# Patient Record
Sex: Female | Born: 1960 | Race: White | Hispanic: No | Marital: Married | Smoking: Never smoker
Health system: Southern US, Community
[De-identification: ages and names within clinical notes are randomized; demographics above are authoritative.]

## PROBLEM LIST (undated history)

## (undated) DIAGNOSIS — K219 Gastro-esophageal reflux disease without esophagitis: Secondary | ICD-10-CM

## (undated) DIAGNOSIS — Z780 Asymptomatic menopausal state: Secondary | ICD-10-CM

## (undated) DIAGNOSIS — E669 Obesity, unspecified: Secondary | ICD-10-CM

## (undated) DIAGNOSIS — H04123 Dry eye syndrome of bilateral lacrimal glands: Secondary | ICD-10-CM

## (undated) DIAGNOSIS — I341 Nonrheumatic mitral (valve) prolapse: Secondary | ICD-10-CM

## (undated) DIAGNOSIS — I10 Essential (primary) hypertension: Secondary | ICD-10-CM

## (undated) DIAGNOSIS — Z79890 Hormone replacement therapy (postmenopausal): Secondary | ICD-10-CM

## (undated) HISTORY — DX: Gastro-esophageal reflux disease without esophagitis: K21.9

## (undated) HISTORY — DX: Asymptomatic menopausal state: Z78.0

## (undated) HISTORY — DX: Obesity, unspecified: E66.9

## (undated) HISTORY — DX: Nonrheumatic mitral (valve) prolapse: I34.1

## (undated) HISTORY — PX: WISDOM TOOTH EXTRACTION: SHX21

## (undated) HISTORY — PX: DILATION AND CURETTAGE OF UTERUS: SHX78

## (undated) HISTORY — DX: Hormone replacement therapy: Z79.890

## (undated) HISTORY — DX: Dry eye syndrome of bilateral lacrimal glands: H04.123

## (undated) HISTORY — DX: Essential (primary) hypertension: I10

---

## 2008-01-17 ENCOUNTER — Other Ambulatory Visit: Admission: RE | Admit: 2008-01-17 | Discharge: 2008-01-17 | Payer: Self-pay | Admitting: Obstetrics and Gynecology

## 2008-01-23 ENCOUNTER — Encounter: Admission: RE | Admit: 2008-01-23 | Discharge: 2008-01-23 | Payer: Self-pay | Admitting: Obstetrics and Gynecology

## 2010-06-21 ENCOUNTER — Other Ambulatory Visit: Admission: RE | Admit: 2010-06-21 | Discharge: 2010-06-21 | Payer: Self-pay | Admitting: Obstetrics and Gynecology

## 2010-06-23 ENCOUNTER — Ambulatory Visit (HOSPITAL_COMMUNITY): Admission: RE | Admit: 2010-06-23 | Discharge: 2010-06-23 | Payer: Self-pay | Admitting: Obstetrics & Gynecology

## 2011-08-03 ENCOUNTER — Other Ambulatory Visit (HOSPITAL_COMMUNITY)
Admission: RE | Admit: 2011-08-03 | Discharge: 2011-08-03 | Disposition: A | Discharge status: Home / Self Care | Payer: BC Managed Care – PPO | Source: Ambulatory Visit | Attending: Obstetrics and Gynecology | Admitting: Obstetrics and Gynecology

## 2011-08-03 DIAGNOSIS — Z01419 Encounter for gynecological examination (general) (routine) without abnormal findings: Secondary | ICD-10-CM | POA: Insufficient documentation

## 2011-10-06 DIAGNOSIS — R072 Precordial pain: Secondary | ICD-10-CM

## 2011-10-06 DIAGNOSIS — R079 Chest pain, unspecified: Secondary | ICD-10-CM

## 2012-02-13 ENCOUNTER — Emergency Department (HOSPITAL_COMMUNITY)
Admission: EM | Admit: 2012-02-13 | Discharge: 2012-02-13 | Disposition: A | Discharge status: Home Health | Payer: No Typology Code available for payment source | Attending: Emergency Medicine | Admitting: Emergency Medicine

## 2012-02-13 ENCOUNTER — Encounter (HOSPITAL_COMMUNITY): Payer: Self-pay

## 2012-02-13 ENCOUNTER — Emergency Department (HOSPITAL_COMMUNITY): Payer: No Typology Code available for payment source

## 2012-02-13 DIAGNOSIS — I10 Essential (primary) hypertension: Secondary | ICD-10-CM | POA: Insufficient documentation

## 2012-02-13 DIAGNOSIS — R079 Chest pain, unspecified: Secondary | ICD-10-CM | POA: Insufficient documentation

## 2012-02-13 DIAGNOSIS — M542 Cervicalgia: Secondary | ICD-10-CM | POA: Insufficient documentation

## 2012-02-13 DIAGNOSIS — N6489 Other specified disorders of breast: Secondary | ICD-10-CM

## 2012-02-13 DIAGNOSIS — S2000XA Contusion of breast, unspecified breast, initial encounter: Secondary | ICD-10-CM | POA: Insufficient documentation

## 2012-02-13 DIAGNOSIS — F17200 Nicotine dependence, unspecified, uncomplicated: Secondary | ICD-10-CM | POA: Insufficient documentation

## 2012-02-13 DIAGNOSIS — I059 Rheumatic mitral valve disease, unspecified: Secondary | ICD-10-CM | POA: Insufficient documentation

## 2012-02-13 LAB — POCT I-STAT, CHEM 8
BUN: 14 mg/dL (ref 6–23)
Calcium, Ion: 1.15 mmol/L (ref 1.12–1.32)
Chloride: 105 mEq/L (ref 96–112)
Creatinine, Ser: 0.7 mg/dL (ref 0.50–1.10)
Glucose, Bld: 137 mg/dL — ABNORMAL HIGH (ref 70–99)
HCT: 36 % (ref 36.0–46.0)
Hemoglobin: 12.2 g/dL (ref 12.0–15.0)
Potassium: 3.8 mEq/L (ref 3.5–5.1)
Sodium: 140 mEq/L (ref 135–145)
TCO2: 26 mmol/L (ref 0–100)

## 2012-02-13 MED ORDER — ONDANSETRON HCL 4 MG/2ML IJ SOLN
4.0000 mg | Freq: Once | INTRAMUSCULAR | Status: AC
  Administered 2012-02-13: 4 mg via INTRAVENOUS
  Filled 2012-02-13: qty 2

## 2012-02-13 MED ORDER — OXYCODONE-ACETAMINOPHEN 5-325 MG PO TABS: 1.0000 | ORAL_TABLET | ORAL | Status: AC | PRN

## 2012-02-13 MED ORDER — HYDROMORPHONE HCL PF 1 MG/ML IJ SOLN
0.5000 mg | Freq: Once | INTRAMUSCULAR | Status: AC
  Administered 2012-02-13: 0.5 mg via INTRAVENOUS
  Filled 2012-02-13: qty 1

## 2012-02-13 MED ORDER — SODIUM CHLORIDE 0.9 % IV BOLUS (SEPSIS)
1000.0000 mL | Freq: Once | INTRAVENOUS | Status: AC
  Administered 2012-02-13: 1000 mL via INTRAVENOUS

## 2012-02-13 MED ORDER — HYDROMORPHONE HCL PF 1 MG/ML IJ SOLN
1.0000 mg | Freq: Once | INTRAMUSCULAR | Status: AC
  Administered 2012-02-13: 0.5 mg via INTRAVENOUS
  Filled 2012-02-13: qty 1

## 2012-02-13 MED ORDER — IOHEXOL 300 MG/ML  SOLN
100.0000 mL | Freq: Once | INTRAMUSCULAR | Status: AC | PRN
  Administered 2012-02-13: 100 mL via INTRAVENOUS
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
## 2012-02-13 NOTE — ED Notes (Signed)
x2 unsuccessful IV attempts made. Patient moved to room 19 to be placed on cardiac monitor. Tiffany Abbott RN in room to attempt IV access.
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
## 2012-04-01 ENCOUNTER — Other Ambulatory Visit: Payer: Self-pay | Admitting: Adult Health

## 2012-04-01 DIAGNOSIS — IMO0002 Reserved for concepts with insufficient information to code with codable children: Secondary | ICD-10-CM

## 2012-04-10 ENCOUNTER — Ambulatory Visit (HOSPITAL_COMMUNITY)
Admission: RE | Admit: 2012-04-10 | Discharge: 2012-04-10 | Disposition: A | Discharge status: Home / Self Care | Payer: BC Managed Care – PPO | Source: Ambulatory Visit | Attending: Adult Health | Admitting: Adult Health

## 2012-04-10 DIAGNOSIS — IMO0002 Reserved for concepts with insufficient information to code with codable children: Secondary | ICD-10-CM

## 2012-05-13 ENCOUNTER — Other Ambulatory Visit: Payer: Self-pay | Admitting: Obstetrics & Gynecology

## 2012-05-13 DIAGNOSIS — N6310 Unspecified lump in the right breast, unspecified quadrant: Secondary | ICD-10-CM

## 2012-06-03 ENCOUNTER — Ambulatory Visit
Admission: RE | Admit: 2012-06-03 | Discharge: 2012-06-03 | Disposition: A | Discharge status: Home / Self Care | Payer: No Typology Code available for payment source | Source: Ambulatory Visit | Attending: Obstetrics & Gynecology | Admitting: Obstetrics & Gynecology

## 2012-06-03 DIAGNOSIS — N6310 Unspecified lump in the right breast, unspecified quadrant: Secondary | ICD-10-CM

## 2013-03-25 ENCOUNTER — Other Ambulatory Visit: Payer: Self-pay | Admitting: Adult Health

## 2013-04-29 ENCOUNTER — Other Ambulatory Visit: Payer: Self-pay | Admitting: Obstetrics and Gynecology

## 2013-04-29 DIAGNOSIS — N63 Unspecified lump in unspecified breast: Secondary | ICD-10-CM

## 2013-06-16 ENCOUNTER — Ambulatory Visit
Admission: RE | Admit: 2013-06-16 | Discharge: 2013-06-16 | Disposition: A | Discharge status: Home / Self Care | Payer: BC Managed Care – PPO | Source: Ambulatory Visit | Attending: Obstetrics and Gynecology | Admitting: Obstetrics and Gynecology

## 2013-06-16 DIAGNOSIS — N63 Unspecified lump in unspecified breast: Secondary | ICD-10-CM

## 2013-10-07 ENCOUNTER — Other Ambulatory Visit: Payer: Self-pay | Admitting: Adult Health

## 2014-04-24 ENCOUNTER — Other Ambulatory Visit: Payer: Self-pay | Admitting: Adult Health

## 2014-04-29 ENCOUNTER — Telehealth: Payer: Self-pay | Admitting: *Deleted

## 2014-04-29 NOTE — Telephone Encounter (Signed)
Spoke with pt letting her know we didn't have any Combipatch samples. JSY

## 2014-05-20 ENCOUNTER — Encounter: Payer: Self-pay | Admitting: Adult Health

## 2014-05-20 ENCOUNTER — Other Ambulatory Visit (HOSPITAL_COMMUNITY)
Admission: RE | Admit: 2014-05-20 | Discharge: 2014-05-20 | Disposition: A | Discharge status: Home / Self Care | Payer: BC Managed Care – PPO | Source: Ambulatory Visit | Attending: Adult Health | Admitting: Adult Health

## 2014-05-20 ENCOUNTER — Other Ambulatory Visit: Payer: Self-pay | Admitting: Obstetrics and Gynecology

## 2014-05-20 ENCOUNTER — Ambulatory Visit (INDEPENDENT_AMBULATORY_CARE_PROVIDER_SITE_OTHER): Payer: BC Managed Care – PPO | Admitting: Adult Health

## 2014-05-20 VITALS — BP 136/96 | Ht 61.5 in | Wt 188.5 lb

## 2014-05-20 DIAGNOSIS — Z01419 Encounter for gynecological examination (general) (routine) without abnormal findings: Secondary | ICD-10-CM | POA: Insufficient documentation

## 2014-05-20 DIAGNOSIS — G479 Sleep disorder, unspecified: Secondary | ICD-10-CM

## 2014-05-20 DIAGNOSIS — Z780 Asymptomatic menopausal state: Secondary | ICD-10-CM

## 2014-05-20 DIAGNOSIS — Z1151 Encounter for screening for human papillomavirus (HPV): Secondary | ICD-10-CM | POA: Insufficient documentation

## 2014-05-20 DIAGNOSIS — Z79890 Hormone replacement therapy (postmenopausal): Secondary | ICD-10-CM

## 2014-05-20 DIAGNOSIS — Z1212 Encounter for screening for malignant neoplasm of rectum: Secondary | ICD-10-CM

## 2014-05-20 DIAGNOSIS — M549 Dorsalgia, unspecified: Secondary | ICD-10-CM | POA: Insufficient documentation

## 2014-05-20 HISTORY — DX: Asymptomatic menopausal state: Z78.0

## 2014-05-20 LAB — HEMOCCULT GUIAC POC 1CARD (OFFICE): Fecal Occult Blood, POC: NEGATIVE

## 2014-05-20 NOTE — Patient Instructions (Signed)
Menopause Menopause is the normal time of life when menstrual periods stop completely. Menopause is complete when you have missed 12 consecutive menstrual periods. It usually occurs between the ages of 48 years and 55 years. Very rarely does a woman develop menopause before the age of 40 years. At menopause, your ovaries stop producing the female hormones estrogen and progesterone. This can cause undesirable symptoms and also affect your health. Sometimes the symptoms may occur 4-5 years before the menopause begins. There is no relationship between menopause and:  Oral contraceptives.  Number of children you had.  Race.  The age your menstrual periods started (menarche). Heavy smokers and very thin women may develop menopause earlier in life. CAUSES  The ovaries stop producing the female hormones estrogen and progesterone.  Other causes include:  Surgery to remove both ovaries.  The ovaries stop functioning for no known reason.  Tumors of the pituitary gland in the brain.  Medical disease that affects the ovaries and hormone production.  Radiation treatment to the abdomen or pelvis.  Chemotherapy that affects the ovaries. SYMPTOMS   Hot flashes.  Night sweats.  Decrease in sex drive.  Vaginal dryness and thinning of the vagina causing painful intercourse.  Dryness of the skin and developing wrinkles.  Headaches.  Tiredness.  Irritability.  Memory problems.  Weight gain.  Bladder infections.  Hair growth of the face and chest.  Infertility. More serious symptoms include:  Loss of bone (osteoporosis) causing breaks (fractures).  Depression.  Hardening and narrowing of the arteries (atherosclerosis) causing heart attacks and strokes. DIAGNOSIS   When the menstrual periods have stopped for 12 straight months.  Physical exam.  Hormone studies of the blood. TREATMENT  There are many treatment choices and nearly as many questions about them. The  decisions to treat or not to treat menopausal changes is an individual choice made with your health care provider. Your health care provider can discuss the treatments with you. Together, you can decide which treatment will work best for you. Your treatment choices may include:   Hormone therapy (estrogen and progesterone).  Non-hormonal medicines.  Treating the individual symptoms with medicine (for example antidepressants for depression).  Herbal medicines that may help specific symptoms.  Counseling by a psychiatrist or psychologist.  Group therapy.  Lifestyle changes including:  Eating healthy.  Regular exercise.  Limiting caffeine and alcohol.  Stress management and meditation.  No treatment. HOME CARE INSTRUCTIONS   Take the medicine your health care provider gives you as directed.  Get plenty of sleep and rest.  Exercise regularly.  Eat a diet that contains calcium (good for the bones) and soy products (acts like estrogen hormone).  Avoid alcoholic beverages.  Do not smoke.  If you have hot flashes, dress in layers.  Take supplements, calcium, and vitamin D to strengthen bones.  You can use over-the-counter lubricants or moisturizers for vaginal dryness.  Group therapy is sometimes very helpful.  Acupuncture may be helpful in some cases. SEEK MEDICAL CARE IF:   You are not sure you are in menopause.  You are having menopausal symptoms and need advice and treatment.  You are still having menstrual periods after age 55 years.  You have pain with intercourse.  Menopause is complete (no menstrual period for 12 months) and you develop vaginal bleeding.  You need a referral to a specialist (gynecologist, psychiatrist, or psychologist) for treatment. SEEK IMMEDIATE MEDICAL CARE IF:   You have severe depression.  You have excessive vaginal bleeding.    You fell and think you have a broken bone.  You have pain when you urinate.  You develop leg or  chest pain.  You have a fast pounding heart beat (palpitations).  You have severe headaches.  You develop vision problems.  You feel a lump in your breast.  You have abdominal pain or severe indigestion. Document Released: 02/10/2004 Document Revised: 07/23/2013 Document Reviewed: 06/19/2013 [facility] Patient Information 2015 [place], [place]. This information is not intended to replace advice given to you by your health care provider. Make sure you discuss any questions you have with your health care provider. Return in 1 week for US and see me Physical in 1 week
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
## 2014-05-21 LAB — CYTOLOGY - PAP

## 2014-05-27 ENCOUNTER — Other Ambulatory Visit: Payer: Self-pay | Admitting: Adult Health

## 2014-05-27 ENCOUNTER — Ambulatory Visit (INDEPENDENT_AMBULATORY_CARE_PROVIDER_SITE_OTHER): Payer: BC Managed Care – PPO

## 2014-05-27 DIAGNOSIS — M549 Dorsalgia, unspecified: Secondary | ICD-10-CM

## 2014-05-27 DIAGNOSIS — Z780 Asymptomatic menopausal state: Secondary | ICD-10-CM

## 2014-05-27 DIAGNOSIS — N951 Menopausal and female climacteric states: Secondary | ICD-10-CM

## 2014-05-28 ENCOUNTER — Telehealth: Payer: Self-pay | Admitting: Adult Health

## 2014-05-28 NOTE — Telephone Encounter (Signed)
Pt aware US normal  

## 2014-08-17 ENCOUNTER — Telehealth: Payer: Self-pay | Admitting: *Deleted

## 2014-08-17 MED ORDER — ESTRADIOL-LEVONORGESTREL 0.045-0.015 MG/DAY TD PTWK: 1.0000 | MEDICATED_PATCH | TRANSDERMAL | Status: DC

## 2014-08-17 NOTE — Telephone Encounter (Signed)
Wants cheaper patch will try climara pro

## 2014-10-02 ENCOUNTER — Other Ambulatory Visit: Payer: Self-pay | Admitting: Adult Health

## 2014-10-02 ENCOUNTER — Other Ambulatory Visit: Payer: Self-pay

## 2014-10-02 DIAGNOSIS — N6310 Unspecified lump in the right breast, unspecified quadrant: Secondary | ICD-10-CM

## 2014-10-05 ENCOUNTER — Encounter: Payer: Self-pay | Admitting: Adult Health

## 2014-10-19 ENCOUNTER — Encounter (INDEPENDENT_AMBULATORY_CARE_PROVIDER_SITE_OTHER): Payer: Self-pay

## 2014-10-19 ENCOUNTER — Ambulatory Visit
Admission: RE | Admit: 2014-10-19 | Discharge: 2014-10-19 | Disposition: A | Discharge status: Home / Self Care | Payer: BC Managed Care – PPO | Source: Ambulatory Visit | Attending: Adult Health | Admitting: Adult Health

## 2014-10-19 DIAGNOSIS — N6310 Unspecified lump in the right breast, unspecified quadrant: Secondary | ICD-10-CM

## 2015-08-13 ENCOUNTER — Other Ambulatory Visit: Payer: Self-pay | Admitting: Adult Health

## 2015-09-01 ENCOUNTER — Ambulatory Visit (INDEPENDENT_AMBULATORY_CARE_PROVIDER_SITE_OTHER): Payer: BLUE CROSS/BLUE SHIELD | Admitting: Adult Health

## 2015-09-01 ENCOUNTER — Encounter: Payer: Self-pay | Admitting: Adult Health

## 2015-09-01 VITALS — BP 124/70 | HR 64 | Ht 61.0 in | Wt 200.0 lb

## 2015-09-01 DIAGNOSIS — F439 Reaction to severe stress, unspecified: Secondary | ICD-10-CM

## 2015-09-01 DIAGNOSIS — Z1211 Encounter for screening for malignant neoplasm of colon: Secondary | ICD-10-CM

## 2015-09-01 DIAGNOSIS — Z01419 Encounter for gynecological examination (general) (routine) without abnormal findings: Secondary | ICD-10-CM | POA: Diagnosis not present

## 2015-09-01 DIAGNOSIS — Z79890 Hormone replacement therapy (postmenopausal): Secondary | ICD-10-CM

## 2015-09-01 DIAGNOSIS — N950 Postmenopausal bleeding: Secondary | ICD-10-CM

## 2015-09-01 LAB — HEMOCCULT GUIAC POC 1CARD (OFFICE): FECAL OCCULT BLD: NEGATIVE

## 2015-09-01 MED ORDER — ESTRADIOL-LEVONORGESTREL 0.045-0.015 MG/DAY TD PTWK: MEDICATED_PATCH | TRANSDERMAL | Status: DC

## 2015-09-01 NOTE — Progress Notes (Signed)
Patient ID: Maria Khan, female   DOB: Sep 01, 1961, 54 y.o.   MRN: [id number] History of Present Illness: Maria Khan is a 54 year old white female,married in for a well woman gyn exam, she had normal pap with negative HPV 05/20/14.Has had some spotting in May and increased stress, with husband having colon rupture and 3 family members have died in past 12 weeks. She is happy with her patches. PCP is [facility].  Current Medications, Allergies, Past Medical History, Past Surgical History, Family History and Social History were reviewed in [employer] record.     Review of Systems: Patient denies any headaches, hearing loss, fatigue, blurred vision, shortness of breath, chest pain, abdominal pain, problems with bowel movements, urination, or intercourse. No joint pain or mood swings.See HPI for positives.    Physical Exam:BP 124/70 mmHg  Pulse 64  Ht  (1.549 m)  Wt 200 lb (90.719 kg)  BMI 37.81 kg/m2 General:  Well developed, well nourished, no acute distress Skin:  Warm and dry Neck:  Midline trachea, normal thyroid, good ROM, no lymphadenopathy Lungs; Clear to auscultation bilaterally Breast:  No dominant palpable mass, retraction, or nipple discharge Cardiovascular: Regular rate and rhythm Abdomen:  Soft, non tender, no hepatosplenomegaly Pelvic:  External genitalia is normal in appearance, no lesions.  The vagina is normal in appearance. Urethra has no lesions or masses. The cervix is bulbous.  Uterus is felt to be normal size, shape, and contour.  No adnexal masses or tenderness noted.Bladder is non tender, no masses felt. Rectal: Good sphincter tone, no polyps, or hemorrhoids felt.  Hemoccult negative. Extremities/musculoskeletal:  No swelling or varicosities noted, no clubbing or cyanosis Psych:  No mood changes, alert and cooperative,seems happy   Impression: Well woman gyn exam no pap HRT PMB Stress    Plan: Return in 1 week for gyn [place] Physical  in 1 year Mammogram yearly Colonoscopy advised Labs with PCP Refilled climara pro #4 use 1 patch weekly with 12 refills Review handout on PMB

## 2015-09-01 NOTE — Patient Instructions (Signed)
Physical in 1 year Mammogram yearly Colonoscopy advised Labs with PCP Return in 1 week for [place]  Postmenopausal Bleeding Postmenopausal bleeding is any bleeding a woman has after she has entered into menopause. Menopause is the end of a woman's fertile years. After menopause, a woman no longer ovulates or has menstrual periods.  Postmenopausal bleeding can be caused by various things. Any type of postmenopausal bleeding, even if it appears to be a typical menstrual period, is concerning. This should be evaluated by your health care provider. Any treatment will depend on the cause of the bleeding. HOME CARE INSTRUCTIONS Monitor your condition for any changes. The following actions may help to alleviate any discomfort you are experiencing:  Avoid the use of tampons and douches as directed by your health care provider.  Change your pads frequently.  Get regular pelvic exams and Pap tests.  Keep all follow-up appointments for diagnostic tests as directed by your health care provider. SEEK MEDICAL CARE IF:   Your bleeding lasts more than 1 week.  You have abdominal pain.  You have bleeding with sexual intercourse. SEEK IMMEDIATE MEDICAL CARE IF:   You have a fever, chills, headache, dizziness, muscle aches, and bleeding.  You have severe pain with bleeding.  You are passing blood clots.  You have bleeding and need more than 1 pad an hour.  You feel faint. MAKE SURE YOU:  Understand these instructions.  Will watch your condition.  Will get help right away if you are not doing well or get worse. Document Released: 02/28/2006 Document Revised: 09/10/2013 Document Reviewed: 06/19/2013 [facility] Patient Information 2015 [place], [place]. This information is not intended to replace advice given to you by your health care provider. Make sure you discuss any questions you have with your health care provider.

## 2015-09-08 ENCOUNTER — Ambulatory Visit (INDEPENDENT_AMBULATORY_CARE_PROVIDER_SITE_OTHER): Payer: BLUE CROSS/BLUE SHIELD

## 2015-09-08 ENCOUNTER — Telehealth: Payer: Self-pay | Admitting: Adult Health

## 2015-09-08 DIAGNOSIS — N950 Postmenopausal bleeding: Secondary | ICD-10-CM

## 2015-09-08 NOTE — Progress Notes (Signed)
PELVIC US TA/TV:normal anteverted uterus,EEC 1.41mm,normal ov's bilat (mobile),no free fluid,no pain during ultrasound

## 2015-09-08 NOTE — Telephone Encounter (Signed)
Pt aware US normal  

## 2016-03-07 ENCOUNTER — Telehealth: Payer: Self-pay | Admitting: Adult Health

## 2016-03-07 MED ORDER — ESTRADIOL-NORETHINDRONE ACET 0.05-0.14 MG/DAY TD PTTW: 1.0000 | MEDICATED_PATCH | TRANSDERMAL | Status: DC

## 2016-03-07 NOTE — Telephone Encounter (Signed)
Wants to change to cheaper patch climara pro $90 a month, will rx combi patch to see if cheaper

## 2016-03-08 ENCOUNTER — Other Ambulatory Visit: Payer: Self-pay | Admitting: *Deleted

## 2016-03-08 ENCOUNTER — Telehealth: Payer: Self-pay | Admitting: Adult Health

## 2016-03-08 MED ORDER — ESTRADIOL-LEVONORGESTREL 0.045-0.015 MG/DAY TD PTWK: 1.0000 | MEDICATED_PATCH | TRANSDERMAL | Status: DC

## 2016-03-08 NOTE — Telephone Encounter (Signed)
Will refill patch

## 2016-03-08 NOTE — Telephone Encounter (Signed)
Spoke with pt. Pt has to start using [facility] but [facility] [place] She needs to change patch today. [facility] told her they didn't make the Combipatch any longer. Thanks!! JSY

## 2016-07-18 ENCOUNTER — Other Ambulatory Visit: Payer: Self-pay | Admitting: Adult Health

## 2016-07-18 DIAGNOSIS — Z1231 Encounter for screening mammogram for malignant neoplasm of breast: Secondary | ICD-10-CM

## 2016-07-25 ENCOUNTER — Ambulatory Visit
Admission: RE | Admit: 2016-07-25 | Discharge: 2016-07-25 | Disposition: A | Discharge status: Home / Self Care | Payer: BLUE CROSS/BLUE SHIELD | Source: Ambulatory Visit | Attending: Adult Health | Admitting: Adult Health

## 2016-07-25 ENCOUNTER — Other Ambulatory Visit: Payer: Self-pay | Admitting: Adult Health

## 2016-07-25 DIAGNOSIS — Z1231 Encounter for screening mammogram for malignant neoplasm of breast: Secondary | ICD-10-CM

## 2016-12-13 ENCOUNTER — Encounter: Payer: Self-pay | Admitting: *Deleted

## 2016-12-14 ENCOUNTER — Other Ambulatory Visit: Payer: Self-pay

## 2016-12-14 ENCOUNTER — Ambulatory Visit (INDEPENDENT_AMBULATORY_CARE_PROVIDER_SITE_OTHER): Payer: BLUE CROSS/BLUE SHIELD

## 2016-12-14 ENCOUNTER — Telehealth: Payer: Self-pay | Admitting: *Deleted

## 2016-12-14 ENCOUNTER — Encounter: Payer: Self-pay | Admitting: Cardiology

## 2016-12-14 ENCOUNTER — Ambulatory Visit (INDEPENDENT_AMBULATORY_CARE_PROVIDER_SITE_OTHER): Payer: BLUE CROSS/BLUE SHIELD | Admitting: Cardiology

## 2016-12-14 VITALS — BP 98/78 | HR 71 | Ht 61.5 in | Wt 188.0 lb

## 2016-12-14 DIAGNOSIS — Z8249 Family history of ischemic heart disease and other diseases of the circulatory system: Secondary | ICD-10-CM

## 2016-12-14 DIAGNOSIS — I10 Essential (primary) hypertension: Secondary | ICD-10-CM

## 2016-12-14 DIAGNOSIS — I341 Nonrheumatic mitral (valve) prolapse: Secondary | ICD-10-CM

## 2016-12-14 DIAGNOSIS — R002 Palpitations: Secondary | ICD-10-CM | POA: Diagnosis not present

## 2016-12-14 LAB — ECHOCARDIOGRAM COMPLETE
HEIGHTINCHES: 61.5 in
Weight: 3008 oz

## 2016-12-14 NOTE — Telephone Encounter (Signed)
-----   Message from Jonelle SidleSamuel G McDowell, MD sent at 12/14/2016 10:51 AM EST ----- Results reviewed. Please let her know that the echocardiogram is overall reassuring, no clear evidence of definitive mitral valve prolapse. LVEF is normal range. We will see what her cardiac monitor shows. A copy of this test should be forwarded to Juliette AlcideBURDINE,STEVEN E, MD.

## 2016-12-14 NOTE — Telephone Encounter (Signed)
Patient informed and copy sent to PCP. 

## 2016-12-14 NOTE — Patient Instructions (Signed)
Medication Instructions:  Continue all current medications.  Labwork: none  Testing/Procedures:  Your physician has requested that you have an echocardiogram. Echocardiography is a painless test that uses sound waves to create images of your heart. It provides your doctor with information about the size and shape of your heart and how well your heart's chambers and valves are working. This procedure takes approximately one hour. There are no restrictions for this procedure.  Your physician has recommended that you wear a 7 day event monitor. Event monitors are medical devices that record the heart's electrical activity. Doctors most often us these monitors to diagnose arrhythmias. Arrhythmias are problems with the speed or rhythm of the heartbeat. The monitor is a small, portable device. You can wear one while you do your normal daily activities. This is usually used to diagnose what is causing palpitations/syncope (passing out).  Office will contact with results via phone or letter.    Follow-Up: 3-4 weeks  Any Other Special Instructions Will Be Listed Below (If Applicable).  If you need a refill on your cardiac medications before your next appointment, please call your pharmacy.

## 2016-12-14 NOTE — Progress Notes (Signed)
Cardiology Office Note  Date: 12/14/2016   ID: Maria Khan, DOB 04/30/1961, MRN [id number]  PCP: Juliette AlcideBURDINE,STEVEN E, MD  Consulting Cardiologist: Nona DellSamuel Tish Begin, MD   Chief Complaint  Patient presents with  . Palpitations    History of Present Illness: Maria Khan is a 56 y.o. female referred for cardiology consultation by Dr. Leandrew KoyanagiBurdine. I reviewed her history and updated the chart. She states that she was diagnosed with mitral valve prolapse several years ago and has had a history of recurring palpitations over time. These palpitations have been associated with emotional stress in the past, but seemed to improve until the last 4-5 months. She describes a sense of very rapid heart rate and fluttering, sometimes associated with a feeling of choking and as if she needs to cough. She has no syncope with these episodes. Symptoms occur sometimes once a day, not consistently. Also feels discomfort in her chest and left arm when this happens. She has been on Corgard for several years. No recent echocardiogram.  I reviewed her current medications which are outlined below. She reports compliance.  He does describe a family history of premature CAD, also mitral valve prolapse. She has a sister who had a stroke at relatively young age, another sister who had some type of cardiac arrhythmia that required cardioversion.  Past Medical History:  Diagnosis Date  . Essential hypertension   . GERD (gastroesophageal reflux disease)   . Menopause 05/20/2014  . Mitral valve prolapse   . Obesity   . On postmenopausal hormone replacement therapy     Past Surgical History:  Procedure Laterality Date  . DILATION AND CURETTAGE OF UTERUS    . WISDOM TOOTH EXTRACTION      Current Outpatient Prescriptions  Medication Sig Dispense Refill  . ALPRAZolam (XANAX) 0.5 MG tablet Take 0.5 mg by mouth 3 (three) times daily.     Marland Kitchen. aspirin EC 81 MG tablet Take 81 mg by mouth as needed. For chest pains    .  esomeprazole (NEXIUM) 40 MG capsule Take 80 mg by mouth daily at 12 noon.    Marland Kitchen. estradiol-levonorgestrel (CLIMARA PRO) 0.045-0.015 MG/DAY Place 1 patch onto the skin once a week. 12 patch 4  . ibuprofen (ADVIL,MOTRIN) 200 MG tablet Take 800 mg by mouth as needed. For pain    . lisinopril (PRINIVIL,ZESTRIL) 5 MG tablet Take 5 mg by mouth at bedtime.    . nadolol (CORGARD) 20 MG tablet Take 20 mg by mouth at bedtime. Take Nadolol 20mg  wih Nadolol 40mg  for a total of 60mg  daily    . nadolol (CORGARD) 40 MG tablet Take 40 mg by mouth at bedtime. Take Nadolol 20mg  wih Nadolol 40mg  for a total of 60mg  daily    . Polyethyl Glycol-Propyl Glycol (SYSTANE) 0.4-0.3 % GEL Apply 1 drop to eye at bedtime.     No current facility-administered medications for this visit.    Allergies:  Codeine   Social History: The patient  reports that she has never smoked. She has never used smokeless tobacco. She reports that she does not drink alcohol or use drugs.   Family History: The patient's family history includes Diabetes in her brother, maternal grandmother, mother, sister, sister, sister, and sister; Heart attack in her mother; Hypertension in her brother, sister, sister, sister, and sister; Melanoma in her father; Mitral valve prolapse in her sister.   ROS:  Please see the history of present illness. Otherwise, complete review of systems is positive for none.  All other systems are reviewed and negative.   Physical Exam: VS:  BP 98/78   Pulse 71   Ht 5' 1.5" (1.562 m)   Wt 188 lb (85.3 kg)   SpO2 98%   BMI 34.95 kg/m , BMI Body mass index is 34.95 kg/m.  Wt Readings from Last 3 Encounters:  12/14/16 188 lb (85.3 kg)  09/01/15 200 lb (90.7 kg)  05/20/14 188 lb 8 oz (85.5 kg)    General: Patient appears comfortable at rest. HEENT: Conjunctiva and lids normal, oropharynx clear. Neck: Supple, no elevated JVP or carotid bruits, no thyromegaly. Lungs: Clear to auscultation, nonlabored breathing at  rest. Cardiac: Regular rate and rhythm, no S3 or significant systolic murmur, no pericardial rub. Abdomen: Soft, nontender, bowel sounds present. Extremities: No pitting edema, distal pulses 2+. Skin: Warm and dry. Musculoskeletal: No kyphosis. Neuropsychiatric: Alert and oriented x3, affect grossly appropriate.  ECG: I personally reviewed the tracing from 11/01/2016 which showed normal sinus rhythm.  Assessment and Plan:  1. Long-standing history of intermittent palpitations, worse over the last 4-5 months as outlined above. She has a reported history of mitral valve prolapse, no midsystolic click obvious on examination. She has been on Corgard for several years. I reviewed her ECG which was normal. Plan is to obtain a 7 day heart monitor to investigate her palpitations further and also an echocardiogram to follow-up cardiac structural assessment. Office follow-up arranged.  2. Reported history of mitral valve prolapse.  3. Essential hypertension, on lisinopril. Blood pressure low normal today.  4. Reported family history of premature CAD, also mitral valve prolapse.  Current medicines were reviewed with the patient today.   Orders Placed This Encounter  Procedures  . Cardiac event monitor  . ECHOCARDIOGRAM COMPLETE    Disposition: Follow-up in the next 3-4 weeks.  Signed, Jonelle Sidle, MD, [facility] 12/14/2016 8:40 AM    [facility] at [facility] [address] [place], [place], [place] [address] Phone: [phone]; Fax: [phone]

## 2016-12-22 ENCOUNTER — Ambulatory Visit (INDEPENDENT_AMBULATORY_CARE_PROVIDER_SITE_OTHER): Payer: BLUE CROSS/BLUE SHIELD

## 2016-12-22 ENCOUNTER — Telehealth: Payer: Self-pay | Admitting: Cardiology

## 2016-12-22 DIAGNOSIS — R002 Palpitations: Secondary | ICD-10-CM

## 2016-12-22 NOTE — Telephone Encounter (Signed)
Mrs. Maria Khan called stating that she has not received her heart monitor.

## 2016-12-22 NOTE — Telephone Encounter (Signed)
Call placed to [employer], stated she should be receiving her monitor today.   Call placed to patient & made aware of above.

## 2017-01-08 NOTE — Progress Notes (Signed)
Cardiology Office Note  Date: 01/10/2017   ID: Maria Khan, DOB 01-31-1961, MRN [id number]  PCP: Juliette Alcide, MD  Primary Cardiologist: Nona Dell, MD   Chief Complaint  Patient presents with  . Follow-up palpitations    History of Present Illness: Maria Khan is a 56 y.o. female seen in consultation in January of this year. She presents for a follow-up visit. States of no change in sense of palpitations which she describes as a fluttering sensation. Worse with stress and caffeine. She has had no chest pain or syncope.  Follow-up echocardiogram in January showed LVEF 60-65% with no clear evidence of mitral valve prolapse and trivial mitral regurgitation. Incidentally noted trivial posterior pericardial effusion as well.  She wore a 7 day cardiac monitor demonstrating sinus rhythm throughout without significant arrhythmias or pauses. I reviewed the results with her today. She continues on Corgard which she has taken for years. She was reluctant to make any change in her medications and we will continue with observation for now.  Past Medical History:  Diagnosis Date  . Essential hypertension   . GERD (gastroesophageal reflux disease)   . Menopause 05/20/2014  . Mitral valve prolapse   . Obesity   . On postmenopausal hormone replacement therapy     Past Surgical History:  Procedure Laterality Date  . DILATION AND CURETTAGE OF UTERUS    . WISDOM TOOTH EXTRACTION      Current Outpatient Prescriptions  Medication Sig Dispense Refill  . ALPRAZolam (XANAX) 0.5 MG tablet Take 0.5 mg by mouth 3 (three) times daily.     Marland Kitchen aspirin EC 81 MG tablet Take 81 mg by mouth as needed. For chest pains    . esomeprazole (NEXIUM) 40 MG capsule Take 80 mg by mouth daily at 12 noon.    Marland Kitchen estradiol-levonorgestrel (CLIMARA PRO) 0.045-0.015 MG/DAY Place 1 patch onto the skin once a week. 12 patch 4  . ibuprofen (ADVIL,MOTRIN) 200 MG tablet Take 800 mg by mouth as needed. For pain      . lisinopril (PRINIVIL,ZESTRIL) 5 MG tablet Take 5 mg by mouth at bedtime.    . nadolol (CORGARD) 20 MG tablet Take 20 mg by mouth at bedtime. Take Nadolol 20mg  wih Nadolol 40mg  for a total of 60mg  daily    . nadolol (CORGARD) 40 MG tablet Take 40 mg by mouth at bedtime. Take Nadolol 20mg  wih Nadolol 40mg  for a total of 60mg  daily    . Phenylephrine-DM-GG-APAP (TYLENOL COLD/FLU SEVERE PO) Take by mouth.    Bertram Gala Glycol-Propyl Glycol (SYSTANE) 0.4-0.3 % GEL Apply 1 drop to eye at bedtime.     No current facility-administered medications for this visit.    Allergies:  Codeine   Social History: The patient  reports that she has never smoked. She has never used smokeless tobacco. She reports that she does not drink alcohol or use drugs.   ROS:  Please see the history of present illness. Otherwise, complete review of systems is positive for cough and cold symptoms.  All other systems are reviewed and negative.   Physical Exam: VS:  BP 118/78   Pulse 67   Ht 5' 1.5" (1.562 m)   Wt 185 lb (83.9 kg)   SpO2 97%   BMI 34.39 kg/m , BMI Body mass index is 34.39 kg/m.  Wt Readings from Last 3 Encounters:  01/10/17 185 lb (83.9 kg)  12/14/16 188 lb (85.3 kg)  09/01/15 200 lb (90.7 kg)  General: Patient appears comfortable at rest. HEENT: Conjunctiva and lids normal, oropharynx clear. Neck: Supple, no elevated JVP or carotid bruits, no thyromegaly. Lungs: Clear to auscultation, nonlabored breathing at rest. Cardiac: Regular rate and rhythm, no S3 or significant systolic murmur, no pericardial rub. Abdomen: Soft, nontender, bowel sounds present. Extremities: No pitting edema, distal pulses 2+. Skin: Warm and dry. Musculoskeletal: No kyphosis. Neuropsychiatric: Alert and oriented x3, affect grossly appropriate.  ECG: I personally reviewed the tracing from 11/01/2016 which showed normal sinus rhythm.  Recent Labwork:  November 2017: Hemoglobin 13.2, platelets 254, BUN 11,  creatinine 0.6, potassium 4.0, AST 14, ALT 21, TSH 1.9  Other Studies Reviewed Today:  Echocardiogram 12/14/2016: Study Conclusions  - Left ventricle: The cavity size was normal. Wall thickness was at   the upper limits of normal. Systolic function was normal. The   estimated ejection fraction was in the range of 60% to 65%. Wall   motion was normal; there were no regional wall motion   abnormalities. - Mitral valve: There was trivial regurgitation. - Right atrium: Central venous pressure (est): 3 mm Hg. - Atrial septum: No defect or patent foramen ovale was identified. - Tricuspid valve: There was trivial regurgitation. - Pulmonary arteries: PA peak pressure: 13 mm Hg (S). - Pericardium, extracardiac: A trivial pericardial effusion was   identified posterior to the heart.  Impressions:  - Upper normal LV wall thickness with LVEF 60-65% and grossly   normal diastolic function. No clear echocardiographic evidence of   mitral valve prolapse. There is trivial mitral regurgitation and   tricuspid regurgitation. Normal estimated PASP. Trivial posterior   pericardial effusion.  Assessment and Plan:  1. History of palpitations, no arrhythmia by recent cardiac monitoring. She has been on Corgard long-term with reported history of previously documented mitral valve prolapse. No chest pain or syncope. Continue with observation. Avoid caffeine and other stimulants.  2. Reported history of mitral valve prolapse. This was not evident by her most recent echocardiogram which showed normal LVEF and only trivial mitral regurgitation.  3. Reported history of premature CAD in her family. Recommend risk factor modification and follow-up with PCP.  4. Essential hypertension, blood pressure is normal today.  Current medicines were reviewed with the patient today.  Disposition: Follow-up in one year.  Signed, Jonelle SidleSamuel G. Dago Jungwirth, MD, [facility] 01/10/2017 8:18 AM    [facility]  [facility] [address], [place], [place] [address] Phone: [phone]; Fax: [phone]

## 2017-01-10 ENCOUNTER — Ambulatory Visit (INDEPENDENT_AMBULATORY_CARE_PROVIDER_SITE_OTHER): Payer: BLUE CROSS/BLUE SHIELD | Admitting: Cardiology

## 2017-01-10 ENCOUNTER — Encounter: Payer: Self-pay | Admitting: Cardiology

## 2017-01-10 VITALS — BP 118/78 | HR 67 | Ht 61.5 in | Wt 185.0 lb

## 2017-01-10 DIAGNOSIS — Z8249 Family history of ischemic heart disease and other diseases of the circulatory system: Secondary | ICD-10-CM | POA: Diagnosis not present

## 2017-01-10 DIAGNOSIS — I10 Essential (primary) hypertension: Secondary | ICD-10-CM | POA: Diagnosis not present

## 2017-01-10 DIAGNOSIS — I341 Nonrheumatic mitral (valve) prolapse: Secondary | ICD-10-CM

## 2017-01-10 DIAGNOSIS — R002 Palpitations: Secondary | ICD-10-CM | POA: Diagnosis not present

## 2017-01-10 NOTE — Patient Instructions (Signed)
Your physician wants you to follow-up in: 1 year with Dr. Diona BrownerMcdowell. You will receive a reminder letter in the mail two months in advance. If you don't receive a letter, please call our office to schedule the follow-up appointment.  Your physician recommends that you continue on your current medications as directed. Please refer to the Current Medication list given to you today.      Thank you for choosing [facility]!!

## 2017-01-27 ENCOUNTER — Emergency Department (HOSPITAL_COMMUNITY): Payer: BLUE CROSS/BLUE SHIELD

## 2017-01-27 ENCOUNTER — Encounter (HOSPITAL_COMMUNITY): Payer: Self-pay | Admitting: Emergency Medicine

## 2017-01-27 ENCOUNTER — Emergency Department (HOSPITAL_COMMUNITY)
Admission: EM | Admit: 2017-01-27 | Discharge: 2017-01-27 | Disposition: A | Discharge status: Home / Self Care | Payer: BLUE CROSS/BLUE SHIELD | Attending: Emergency Medicine | Admitting: Emergency Medicine

## 2017-01-27 DIAGNOSIS — I10 Essential (primary) hypertension: Secondary | ICD-10-CM | POA: Diagnosis not present

## 2017-01-27 DIAGNOSIS — R221 Localized swelling, mass and lump, neck: Secondary | ICD-10-CM | POA: Diagnosis not present

## 2017-01-27 DIAGNOSIS — J3489 Other specified disorders of nose and nasal sinuses: Secondary | ICD-10-CM

## 2017-01-27 DIAGNOSIS — Z79899 Other long term (current) drug therapy: Secondary | ICD-10-CM | POA: Insufficient documentation

## 2017-01-27 DIAGNOSIS — Z7982 Long term (current) use of aspirin: Secondary | ICD-10-CM | POA: Insufficient documentation

## 2017-01-27 DIAGNOSIS — R079 Chest pain, unspecified: Secondary | ICD-10-CM | POA: Diagnosis not present

## 2017-01-27 DIAGNOSIS — R51 Headache: Secondary | ICD-10-CM | POA: Insufficient documentation

## 2017-01-27 LAB — BASIC METABOLIC PANEL
Anion gap: 6 (ref 5–15)
BUN: 16 mg/dL (ref 6–20)
CALCIUM: 8.6 mg/dL — AB (ref 8.9–10.3)
CO2: 27 mmol/L (ref 22–32)
CREATININE: 0.53 mg/dL (ref 0.44–1.00)
Chloride: 105 mmol/L (ref 101–111)
GFR calc non Af Amer: >60 mL/min (ref >60)
Glucose, Bld: 119 mg/dL — ABNORMAL HIGH (ref 65–99)
Potassium: 3.9 mmol/L (ref 3.5–5.1)
Sodium: 138 mmol/L (ref 135–145)

## 2017-01-27 LAB — I-STAT TROPONIN, ED: TROPONIN I, POC: 0 ng/mL (ref 0.00–0.08)

## 2017-01-27 LAB — CBC
HCT: 38.7 % (ref 36.0–46.0)
Hemoglobin: 12.8 g/dL (ref 12.0–15.0)
MCH: 31.3 pg (ref 26.0–34.0)
MCHC: 33.1 g/dL (ref 30.0–36.0)
MCV: 94.6 fL (ref 78.0–100.0)
Platelets: 264 10*3/uL (ref 150–400)
RBC: 4.09 MIL/uL (ref 3.87–5.11)
RDW: 12.6 % (ref 11.5–15.5)
WBC: 7.1 10*3/uL (ref 4.0–10.5)

## 2017-01-27 MED ORDER — TRIAMCINOLONE ACETONIDE 55 MCG/ACT NA AERO: 2.0000 | INHALATION_SPRAY | Freq: Every day | NASAL | 12 refills | Status: DC

## 2017-01-27 MED ORDER — AMOXICILLIN-POT CLAVULANATE 875-125 MG PO TABS: 1.0000 | ORAL_TABLET | Freq: Two times a day (BID) | ORAL | 0 refills | Status: DC

## 2017-01-27 NOTE — ED Triage Notes (Signed)
Pt states she woke up this morning have tightness in throat and some chest pain.  Pt states she has been having these symptoms on and off for a couple years and was recently evaluated by a cardiologist.  Pt also c/o cough, congestion.

## 2017-01-27 NOTE — ED Provider Notes (Signed)
[facility] DEPT Provider Note   CSN: [id number] Arrival date & time: 01/27/17  4098  Time seen 05:50 AM   History   Chief Complaint Chief Complaint  Patient presents with  . Chest Pain    HPI Maria Khan is a 56 y.o. female.  HPI  patient states she has had a sinus infection for the past month. She has been on a Z-Pak twice, the second time was 2 pills a day for 3 days, and she was started on Cefprozil on February 19. She states she took it about 11 PM this evening. She woke up at 3 AM and felt short of breath and felt hot. She states she felt like her throat was closing up. She was able to drink. She states she felt little sweaty but she denies any itching or rash. She denies chest pain tonight, nausea, vomiting or diarrhea. She does complain of her teeth hurting from her sinus infection. She states she is unable to smell or taste anything for the past week. She states the feeling of her throat closing up left while driving to the ED. She had taken a Xanax at home. She estimates it lasted about an hour.  Patient states she has a fluttering in her chest and was evaluated by cardiology who did a weeklong monitor that did not show any arrhythmia. She is upset because she has a family history of heart disease. She feels like they are not taking her symptoms seriously.  Patient sees an ENT in [place] for a growth near her right tonsil. She states she's had it for 20 years.  Past Medical History:  Diagnosis Date  . Essential hypertension   . GERD (gastroesophageal reflux disease)   . Menopause 05/20/2014  . Mitral valve prolapse   . Obesity   . On postmenopausal hormone replacement therapy     Patient Active Problem List   Diagnosis Date Noted  . PMB (postmenopausal bleeding) 09/01/2015  . Stress at home 09/01/2015  . Back pain 05/20/2014  . Sleep disturbance 05/20/2014  . Menopause 05/20/2014  . Hormone replacement therapy (HRT) 05/20/2014    Past Surgical History:    Procedure Laterality Date  . DILATION AND CURETTAGE OF UTERUS    . WISDOM TOOTH EXTRACTION      OB History    Gravida Para Term Preterm AB Living   3 2     1 2    SAB TAB Ectopic Multiple Live Births   1       2       Home Medications    Prior to Admission medications   Medication Sig Start Date End Date Taking? Authorizing Provider  ALPRAZolam Prudy Feeler) 0.5 MG tablet Take 0.5 mg by mouth 3 (three) times daily.     Historical Provider, MD  amoxicillin-clavulanate (AUGMENTIN) 875-125 MG tablet Take 1 tablet by mouth every 12 (twelve) hours. 01/27/17   Devoria Albe, MD  aspirin EC 81 MG tablet Take 81 mg by mouth as needed. For chest pains    Historical Provider, MD  esomeprazole (NEXIUM) 40 MG capsule Take 80 mg by mouth daily at 12 noon.    Historical Provider, MD  estradiol-levonorgestrel (CLIMARA PRO) 0.045-0.015 MG/DAY Place 1 patch onto the skin once a week. 03/08/16   Adline Potter, NP  ibuprofen (ADVIL,MOTRIN) 200 MG tablet Take 800 mg by mouth as needed. For pain    Historical Provider, MD  lisinopril (PRINIVIL,ZESTRIL) 5 MG tablet Take 5 mg by mouth at bedtime.  Historical Provider, MD  nadolol (CORGARD) 20 MG tablet Take 20 mg by mouth at bedtime. Take Nadolol 20mg  wih Nadolol 40mg  for a total of 60mg  daily    Historical Provider, MD  nadolol (CORGARD) 40 MG tablet Take 40 mg by mouth at bedtime. Take Nadolol 20mg  wih Nadolol 40mg  for a total of 60mg  daily    Historical Provider, MD  Phenylephrine-DM-GG-APAP (TYLENOL COLD/FLU SEVERE PO) Take by mouth.    Historical Provider, MD  Polyethyl Glycol-Propyl Glycol (SYSTANE) 0.4-0.3 % GEL Apply 1 drop to eye at bedtime.    Historical Provider, MD  triamcinolone (NASACORT AQ) 55 MCG/ACT AERO nasal inhaler Place 2 sprays into the nose daily. 01/27/17   Devoria Albe, MD    Family History Family History  Problem Relation Age of Onset  . Diabetes Mother   . Heart attack Mother   . Diabetes Sister   . Hypertension Sister   . Mitral  valve prolapse Sister   . Diabetes Brother   . Hypertension Brother   . Diabetes Maternal Grandmother   . Diabetes Sister   . Hypertension Sister   . Diabetes Sister   . Hypertension Sister   . Diabetes Sister   . Hypertension Sister   . Melanoma Father     Social History Social History  Substance Use Topics  . Smoking status: Never Smoker  . Smokeless tobacco: Never Used  . Alcohol use No  quit working when her husband had 3 deaths in his family in 3 months   Allergies   Codeine   Review of Systems Review of Systems  All other systems reviewed and are negative.    Physical Exam Updated Vital Signs BP 138/76 (BP Location: Left Arm)   Pulse 66   Temp 98.1 F (36.7 C) (Oral)   Resp 17   Ht 5' 1.5" (1.562 m)   Wt 176 lb (79.8 kg)   SpO2 99%   BMI 32.72 kg/m   Vital signs normal    Physical Exam  Constitutional: She is oriented to person, place, and time. She appears well-developed and well-nourished.  Non-toxic appearance. She does not appear ill. No distress.  HENT:  Head: Normocephalic and atraumatic.  Right Ear: External ear normal.  Left Ear: External ear normal.  Nose: Nose normal. No mucosal edema or rhinorrhea.  Mouth/Throat: Oropharynx is clear and moist and mucous membranes are normal. No dental abscesses or uvula swelling.  Her nasal passages are not boggy.  Eyes: Conjunctivae and EOM are normal. Pupils are equal, round, and reactive to light.  Neck: Normal range of motion and full passive range of motion without pain. Neck supple.  Cardiovascular: Normal rate, regular rhythm and normal heart sounds.  Exam reveals no gallop and no friction rub.   No murmur heard. Pulmonary/Chest: Effort normal and breath sounds normal. No respiratory distress. She has no wheezes. She has no rhonchi. She has no rales. She exhibits no tenderness and no crepitus.  Abdominal: Soft. Normal appearance and bowel sounds are normal. She exhibits no distension. There is no  tenderness. There is no rebound and no guarding.  Musculoskeletal: Normal range of motion. She exhibits no edema or tenderness.  Moves all extremities well.   Neurological: She is alert and oriented to person, place, and time. She has normal strength. No cranial nerve deficit.  Skin: Skin is warm, dry and intact. No rash noted. No erythema. No pallor.  Psychiatric: Her speech is normal and behavior is normal. Her mood appears anxious.  Nursing note and vitals reviewed.    ED Treatments / Results  Labs (all labs ordered are listed, but only abnormal results are displayed) Results for orders placed or performed during the hospital encounter of 01/27/17  Basic metabolic panel  Result Value Ref Range   Sodium 138 135 - 145 mmol/L   Potassium 3.9 3.5 - 5.1 mmol/L   Chloride 105 101 - 111 mmol/L   CO2 27 22 - 32 mmol/L   Glucose, Bld 119 (H) 65 - 99 mg/dL   BUN 16 6 - 20 mg/dL   Creatinine, Ser 1.61 0.44 - 1.00 mg/dL   Calcium 8.6 (L) 8.9 - 10.3 mg/dL   GFR calc non Af Amer >60 >60 mL/min   GFR calc Af Amer >60 >60 mL/min   Anion gap 6 5 - 15  CBC  Result Value Ref Range   WBC 7.1 4.0 - 10.5 K/uL   RBC 4.09 3.87 - 5.11 MIL/uL   Hemoglobin 12.8 12.0 - 15.0 g/dL   HCT 09.6 04.5 - 40.9 %   MCV 94.6 78.0 - 100.0 fL   MCH 31.3 26.0 - 34.0 pg   MCHC 33.1 30.0 - 36.0 g/dL   RDW 81.1 91.4 - 78.2 %   Platelets 264 150 - 400 K/uL  I-Stat Troponin, ED (not at [facility])  Result Value Ref Range   Troponin i, poc 0.00 0.00 - 0.08 ng/mL   Comment 3           Laboratory interpretation all normal     EKG  EKG Interpretation  Date/Time:  Saturday January 27 2017 05:40:01 EST Ventricular Rate:  63 PR Interval:    QRS Duration: 79 QT Interval:  403 QTC Calculation: 413 R Axis:   17 Text Interpretation:  Sinus rhythm Low voltage, precordial leads No old tracing to compare Confirmed by Kaegan Hettich  MD-I, Maika Mcelveen ([id number]) on 01/27/2017 5:49:50 AM       Radiology Dg Chest 2 View  Result Date:  01/27/2017 CLINICAL DATA:  Initial evaluation for acute chest pain. EXAM: CHEST  2 VIEW COMPARISON:  Prior CT from 02/13/2012. FINDINGS: The cardiac and mediastinal silhouettes are stable in size and contour, and remain within normal limits. The lungs are normally inflated. No airspace consolidation, pleural effusion, or pulmonary edema is identified. There is no pneumothorax. No acute osseous abnormality identified. IMPRESSION: No active cardiopulmonary disease. Electronically Signed   By: Rise Mu M.D.   On: 01/27/2017 06:56    Procedures Procedures (including critical care time)  Medications Ordered in ED Medications - No data to display   Initial Impression / Assessment and Plan / ED Course  I have reviewed the triage vital signs and the nursing notes.  Pertinent labs & imaging results that were available during my care of the patient were reviewed by me and considered in my medical decision making (see chart for details).  Pt's feeling of her throat closing had already resolved prior to coming to the ED. She denies any reason that she can't take Amoxil/Augmentin. Will have her stop the cefprozil, I am not sure she had an allergic reaction, she got better after she took a xanax. She states she has seen an ENT before, she should consider being seen again if not improving on the augmentin and steroid nasal spray.   Final Clinical Impressions(s) / ED Diagnoses   Final diagnoses:  Throat swelling  Sinus pain    New Prescriptions New Prescriptions   AMOXICILLIN-CLAVULANATE (AUGMENTIN) 875-125 MG TABLET  Take 1 tablet by mouth every 12 (twelve) hours.   TRIAMCINOLONE (NASACORT AQ) 55 MCG/ACT AERO NASAL INHALER    Place 2 sprays into the nose daily.    Plan discharge  Devoria AlbeIva Miloh Alcocer, MD, [facility]    Charlea Nardo, MD 01/27/17 [phone]

## 2017-01-27 NOTE — Discharge Instructions (Signed)
Use heat on your face for comfort. Stop the Cefprozil, I'm not sure you had an allergic reaction, but better to stop. Start the Augmentin. Use the steroid nasal spray to help your sinuses drain. Consider seeing the ENT you saw before if you aren't improving.

## 2017-05-23 ENCOUNTER — Encounter: Payer: Self-pay | Admitting: Adult Health

## 2017-05-23 ENCOUNTER — Other Ambulatory Visit (HOSPITAL_COMMUNITY)
Admission: RE | Admit: 2017-05-23 | Discharge: 2017-05-23 | Disposition: A | Discharge status: Home / Self Care | Payer: BLUE CROSS/BLUE SHIELD | Source: Ambulatory Visit | Attending: Adult Health | Admitting: Adult Health

## 2017-05-23 ENCOUNTER — Ambulatory Visit (INDEPENDENT_AMBULATORY_CARE_PROVIDER_SITE_OTHER): Payer: BLUE CROSS/BLUE SHIELD | Admitting: Adult Health

## 2017-05-23 VITALS — BP 126/78 | HR 74 | Ht 61.5 in | Wt 184.0 lb

## 2017-05-23 DIAGNOSIS — Z79890 Hormone replacement therapy (postmenopausal): Secondary | ICD-10-CM | POA: Diagnosis not present

## 2017-05-23 DIAGNOSIS — Z1211 Encounter for screening for malignant neoplasm of colon: Secondary | ICD-10-CM

## 2017-05-23 DIAGNOSIS — Z01419 Encounter for gynecological examination (general) (routine) without abnormal findings: Secondary | ICD-10-CM | POA: Diagnosis not present

## 2017-05-23 DIAGNOSIS — Z1212 Encounter for screening for malignant neoplasm of rectum: Secondary | ICD-10-CM | POA: Diagnosis not present

## 2017-05-23 LAB — HEMOCCULT GUIAC POC 1CARD (OFFICE): Fecal Occult Blood, POC: NEGATIVE

## 2017-05-23 MED ORDER — ESTRADIOL-NORETHINDRONE ACET 0.05-0.14 MG/DAY TD PTTW: 1.0000 | MEDICATED_PATCH | TRANSDERMAL | 12 refills | Status: DC

## 2017-05-23 NOTE — Progress Notes (Signed)
Patient ID: Maria Khan A Gaugh, female   DOB: 12/22/1960, 56 y.o.   MRN: [id number] History of Present Illness:  Maria Khan is a 56 year old white female, married, in for well woman gyn exam and pap.She has been on climara pro and no longer available so insurance says to use Combipatch, which she was on in past.  PCP is [facility].  Current Medications, Allergies, Past Medical History, Past Surgical History, Family History and Social History were reviewed in [facility] electronic medical record.     Review of Systems: Patient denies any headaches, hearing loss, fatigue, blurred vision, shortness of breath, chest pain, abdominal pain, problems with bowel movements, urination, or intercourse(not having sex). No joint pain or mood swings.Has pain at times right arm burns for a few seconds,can be "ill" at times.  Has stress at home.   Physical Exam:BP 126/78 (BP Location: Right Arm, Patient Position: Sitting, Cuff Size: Normal)   Pulse 74   Ht 5' 1.5" (1.562 m)   Wt 184 lb (83.5 kg)   BMI 34.20 kg/m  General:  Well developed, well nourished, no acute distress Skin:  Warm and dry Neck:  Midline trachea, normal thyroid, good ROM, no lymphadenopathy Lungs; Clear to auscultation bilaterally Breast:  No dominant palpable mass, retraction, or nipple discharge Cardiovascular: Regular rate and rhythm Abdomen:  Soft, non tender, no hepatosplenomegaly Pelvic:  External genitalia is normal in appearance, no lesions.  The vagina is normal in appearance. Urethra has no lesions or masses. The cervix is bulbous. Pap with HPV performed. Uterus is felt to be normal size, shape, and contour.  No adnexal masses or tenderness noted.Bladder is non tender, no masses felt. Rectal: Good sphincter tone, no polyps, or hemorrhoids felt.  Hemoccult negative. Extremities/musculoskeletal:  No swelling or varicosities noted, no clubbing or cyanosis Psych:  No mood changes, alert and cooperative,seems happy PHQ 2 score  0.  Impression: 1. Encounter for gynecological examination with Papanicolaou smear of cervix   2. Screening for colorectal cancer   3. Hormone replacement therapy (HRT)       Plan: Rx Combipatch 0.05-0.14 mg 1 patch 2 x weekly, # 8 with 12 refills Physical in 1 year Pap in 3 if normal Mammogram yearly Colonoscopy advised Labs with PCP

## 2017-05-25 LAB — CYTOLOGY - PAP
Adequacy: ABSENT
Diagnosis: NEGATIVE
HPV: NOT DETECTED

## 2017-05-31 IMAGING — DX DG CHEST 2V
2 series · 2 of 2 positions shown · non-contrast
Comparison: Prior CT from 02/13/2012.

CLINICAL DATA: Initial evaluation for acute chest pain.

EXAM:
CHEST  2 VIEW

[chest pa]
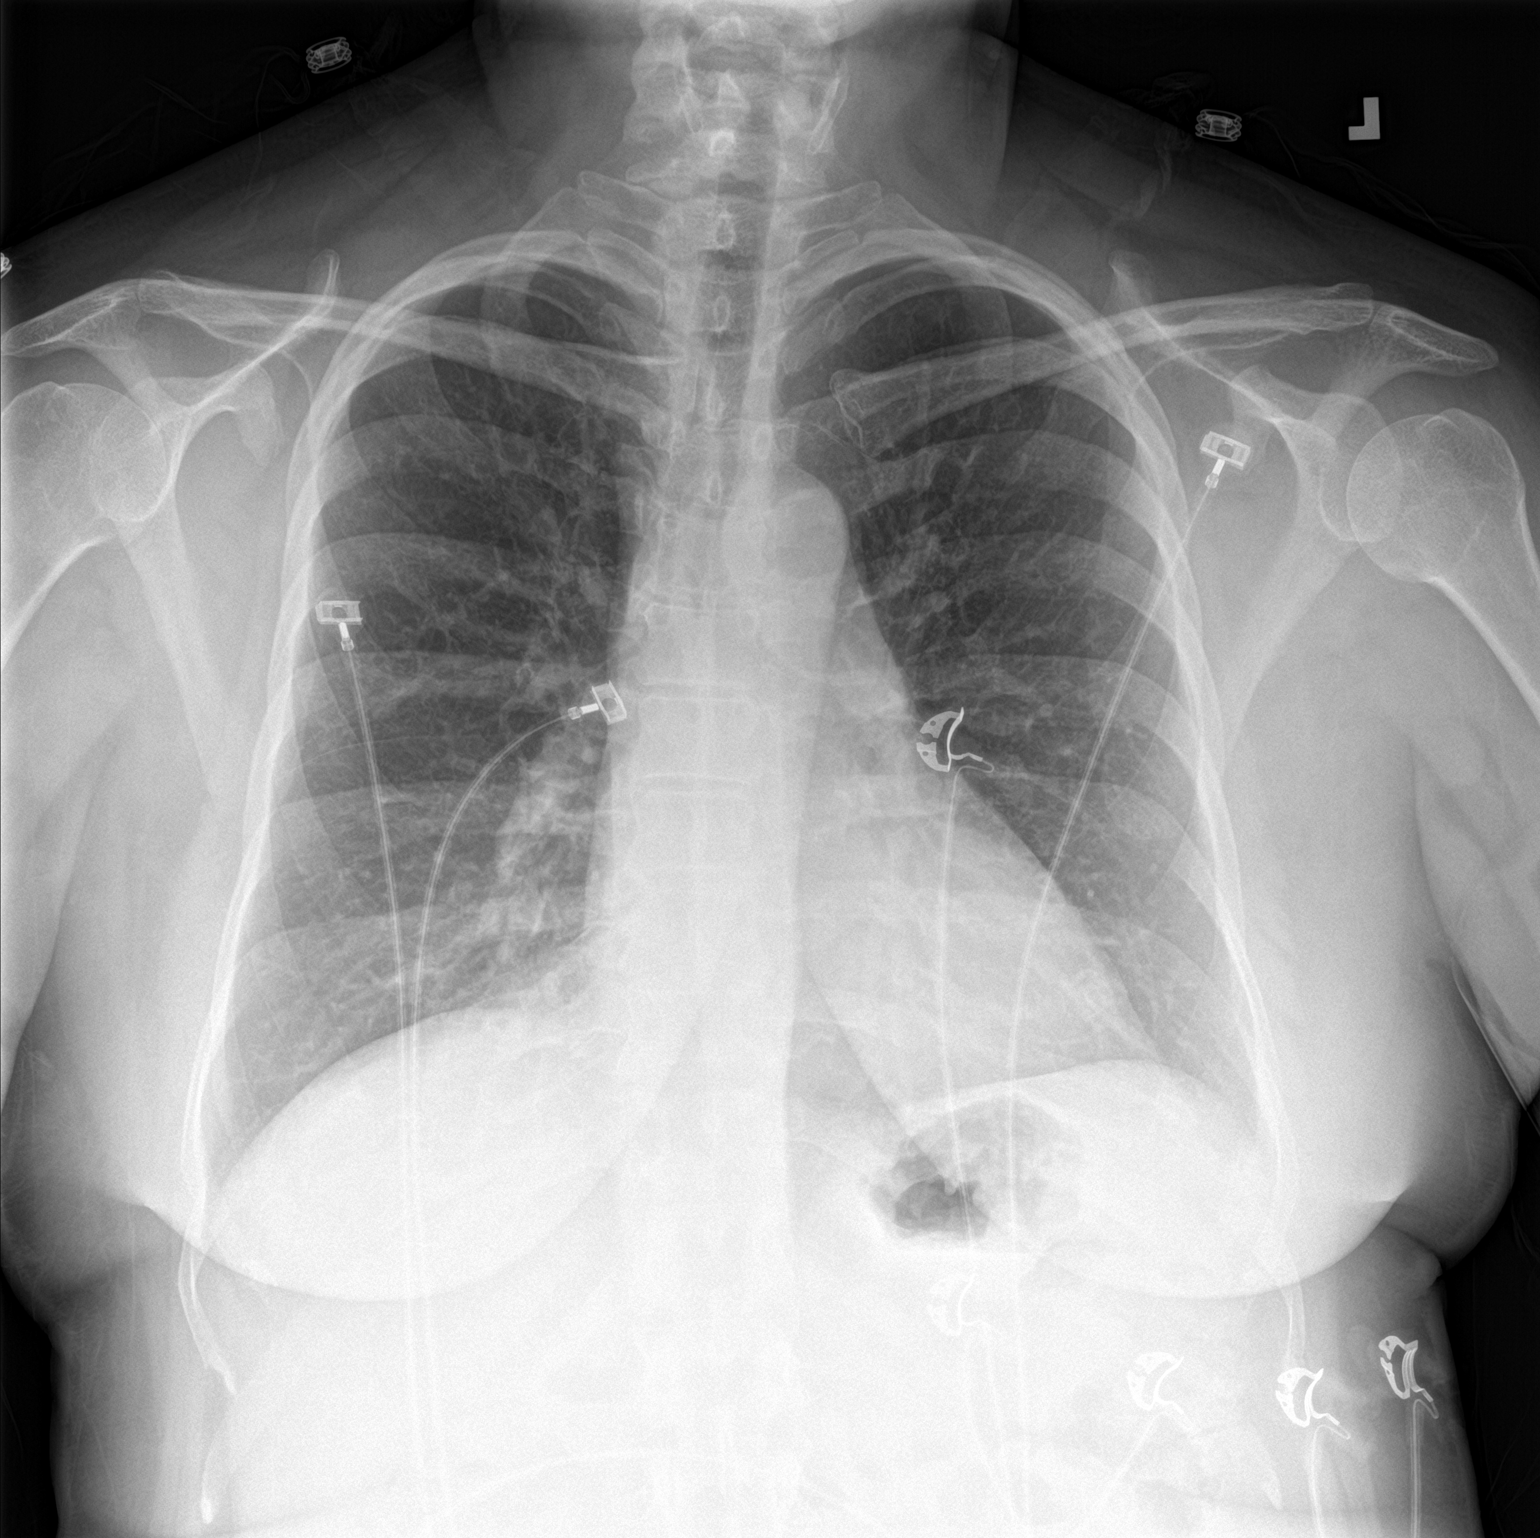

[chest lat]
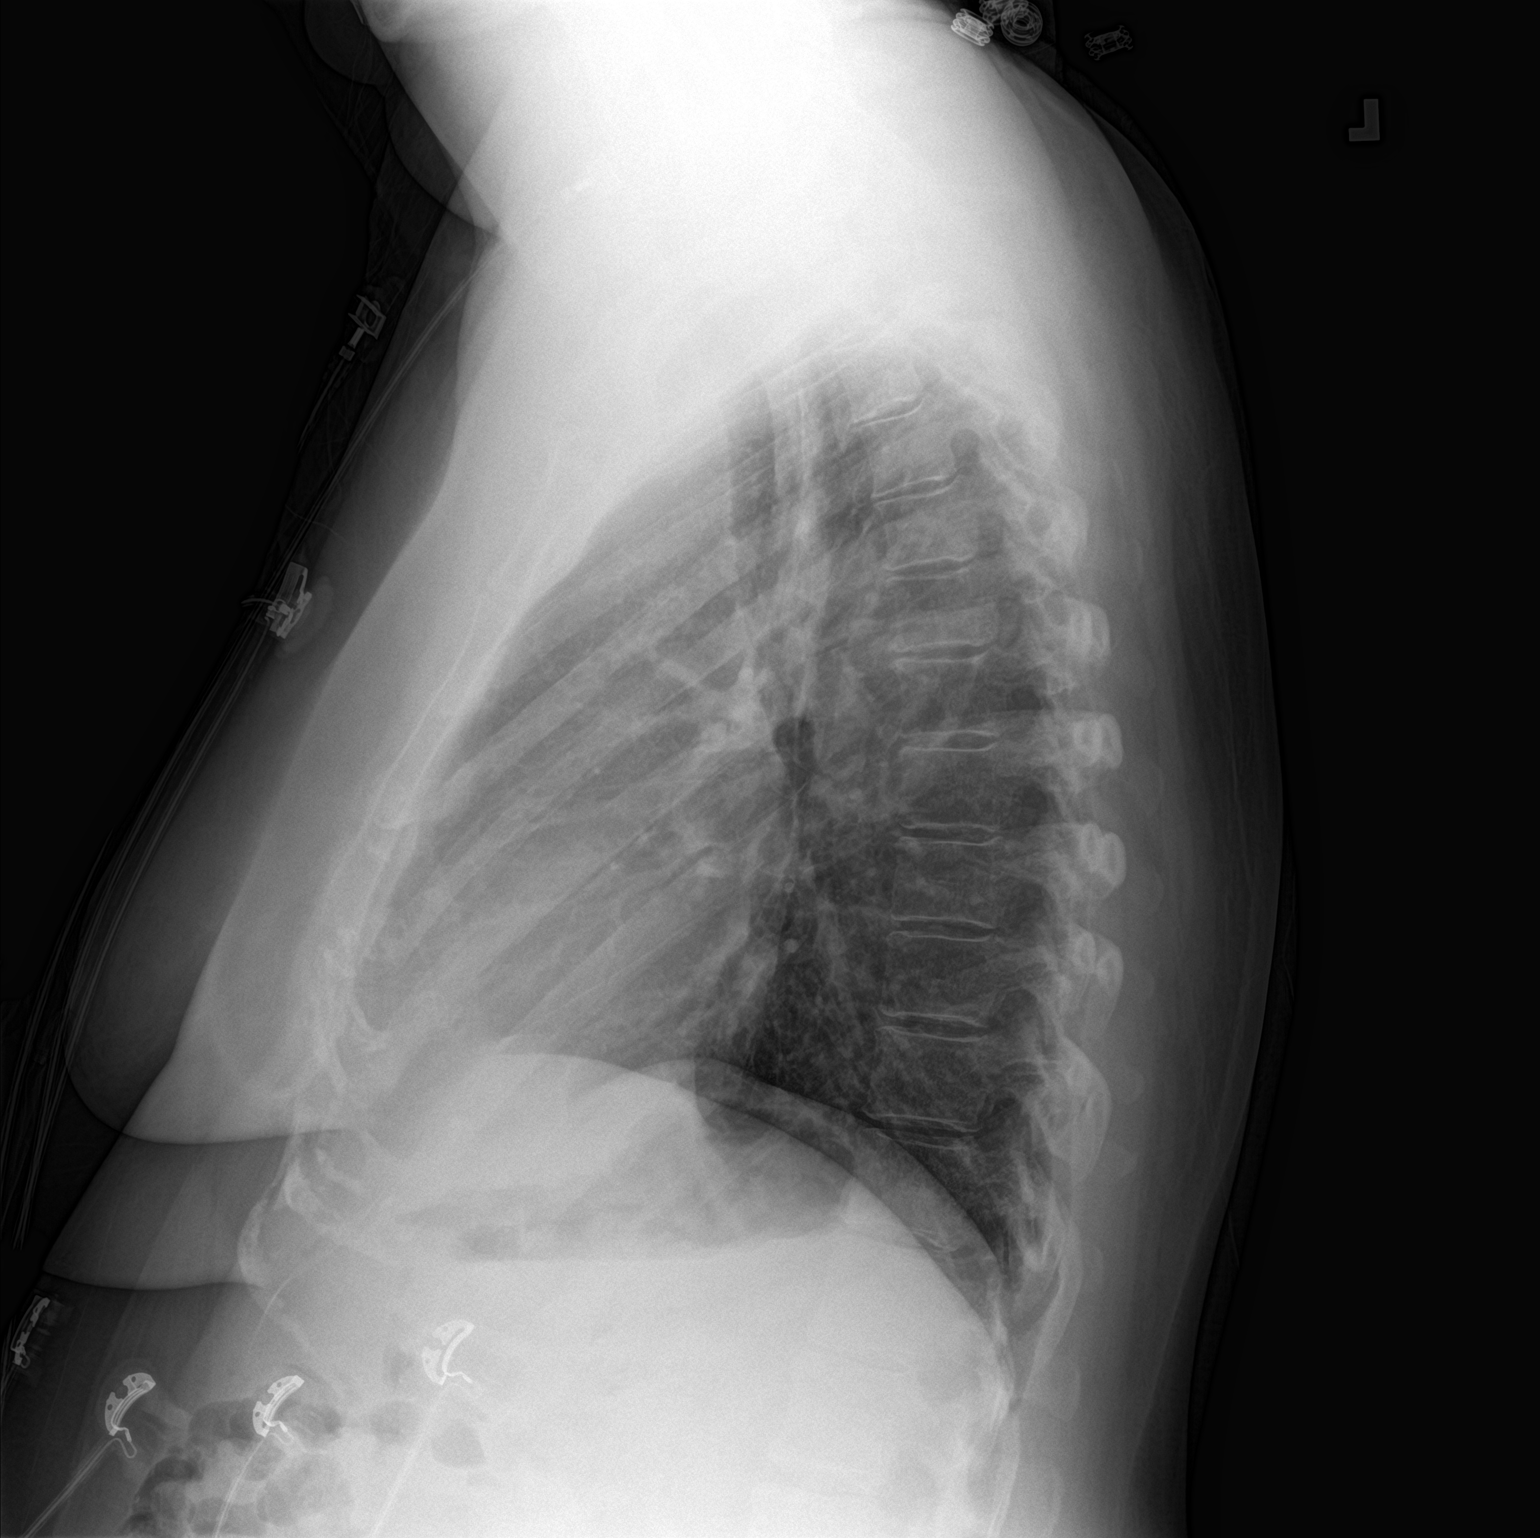

[2 of 2 positions shown; findings below may reference images not displayed]

FINDINGS: The cardiac and mediastinal silhouettes are stable in size and
contour, and remain within normal limits.

The lungs are normally inflated. No airspace consolidation, pleural
effusion, or pulmonary edema is identified. There is no
pneumothorax.

No acute osseous abnormality identified.
IMPRESSION: No active cardiopulmonary disease.

## 2017-10-05 ENCOUNTER — Other Ambulatory Visit: Payer: Self-pay | Admitting: Adult Health

## 2017-10-05 DIAGNOSIS — Z1231 Encounter for screening mammogram for malignant neoplasm of breast: Secondary | ICD-10-CM

## 2017-11-15 ENCOUNTER — Ambulatory Visit: Payer: BLUE CROSS/BLUE SHIELD

## 2017-11-23 ENCOUNTER — Ambulatory Visit
Admission: RE | Admit: 2017-11-23 | Discharge: 2017-11-23 | Disposition: A | Discharge status: Home / Self Care | Payer: BLUE CROSS/BLUE SHIELD | Source: Ambulatory Visit | Attending: Adult Health | Admitting: Adult Health

## 2017-11-23 DIAGNOSIS — Z1231 Encounter for screening mammogram for malignant neoplasm of breast: Secondary | ICD-10-CM

## 2018-02-25 DIAGNOSIS — H11002 Unspecified pterygium of left eye: Secondary | ICD-10-CM | POA: Diagnosis not present

## 2018-02-25 DIAGNOSIS — H02889 Meibomian gland dysfunction of unspecified eye, unspecified eyelid: Secondary | ICD-10-CM | POA: Diagnosis not present

## 2018-02-25 DIAGNOSIS — L718 Other rosacea: Secondary | ICD-10-CM | POA: Diagnosis not present

## 2018-03-28 DIAGNOSIS — R7309 Other abnormal glucose: Secondary | ICD-10-CM | POA: Diagnosis not present

## 2018-03-28 DIAGNOSIS — I10 Essential (primary) hypertension: Secondary | ICD-10-CM | POA: Diagnosis not present

## 2018-03-28 DIAGNOSIS — E782 Mixed hyperlipidemia: Secondary | ICD-10-CM | POA: Diagnosis not present

## 2018-03-28 DIAGNOSIS — K219 Gastro-esophageal reflux disease without esophagitis: Secondary | ICD-10-CM | POA: Diagnosis not present

## 2018-04-01 DIAGNOSIS — E782 Mixed hyperlipidemia: Secondary | ICD-10-CM | POA: Diagnosis not present

## 2018-04-01 DIAGNOSIS — R002 Palpitations: Secondary | ICD-10-CM | POA: Diagnosis not present

## 2018-05-20 DIAGNOSIS — J019 Acute sinusitis, unspecified: Secondary | ICD-10-CM | POA: Diagnosis not present

## 2018-07-22 DIAGNOSIS — R6884 Jaw pain: Secondary | ICD-10-CM | POA: Diagnosis not present

## 2018-07-22 DIAGNOSIS — R112 Nausea with vomiting, unspecified: Secondary | ICD-10-CM | POA: Diagnosis not present

## 2018-07-22 DIAGNOSIS — R002 Palpitations: Secondary | ICD-10-CM | POA: Diagnosis not present

## 2018-07-22 DIAGNOSIS — I10 Essential (primary) hypertension: Secondary | ICD-10-CM | POA: Diagnosis not present

## 2018-08-06 ENCOUNTER — Other Ambulatory Visit: Payer: Self-pay | Admitting: Adult Health

## 2018-09-17 ENCOUNTER — Other Ambulatory Visit: Payer: Self-pay | Admitting: Adult Health

## 2018-09-17 DIAGNOSIS — Z1231 Encounter for screening mammogram for malignant neoplasm of breast: Secondary | ICD-10-CM

## 2018-09-18 DIAGNOSIS — S338XXA Sprain of other parts of lumbar spine and pelvis, initial encounter: Secondary | ICD-10-CM | POA: Diagnosis not present

## 2018-09-18 DIAGNOSIS — M7060 Trochanteric bursitis, unspecified hip: Secondary | ICD-10-CM | POA: Diagnosis not present

## 2018-09-25 DIAGNOSIS — S338XXA Sprain of other parts of lumbar spine and pelvis, initial encounter: Secondary | ICD-10-CM | POA: Diagnosis not present

## 2018-09-25 DIAGNOSIS — M7060 Trochanteric bursitis, unspecified hip: Secondary | ICD-10-CM | POA: Diagnosis not present

## 2018-09-28 ENCOUNTER — Other Ambulatory Visit: Payer: Self-pay | Admitting: Adult Health

## 2018-09-30 DIAGNOSIS — S338XXA Sprain of other parts of lumbar spine and pelvis, initial encounter: Secondary | ICD-10-CM | POA: Diagnosis not present

## 2018-09-30 DIAGNOSIS — M7060 Trochanteric bursitis, unspecified hip: Secondary | ICD-10-CM | POA: Diagnosis not present

## 2018-10-04 DIAGNOSIS — S338XXA Sprain of other parts of lumbar spine and pelvis, initial encounter: Secondary | ICD-10-CM | POA: Diagnosis not present

## 2018-10-04 DIAGNOSIS — M7060 Trochanteric bursitis, unspecified hip: Secondary | ICD-10-CM | POA: Diagnosis not present

## 2018-10-09 DIAGNOSIS — S338XXA Sprain of other parts of lumbar spine and pelvis, initial encounter: Secondary | ICD-10-CM | POA: Diagnosis not present

## 2018-10-09 DIAGNOSIS — R002 Palpitations: Secondary | ICD-10-CM | POA: Diagnosis not present

## 2018-10-09 DIAGNOSIS — M7060 Trochanteric bursitis, unspecified hip: Secondary | ICD-10-CM | POA: Diagnosis not present

## 2018-10-09 DIAGNOSIS — E782 Mixed hyperlipidemia: Secondary | ICD-10-CM | POA: Diagnosis not present

## 2018-11-22 DIAGNOSIS — S338XXA Sprain of other parts of lumbar spine and pelvis, initial encounter: Secondary | ICD-10-CM | POA: Diagnosis not present

## 2018-11-22 DIAGNOSIS — M7060 Trochanteric bursitis, unspecified hip: Secondary | ICD-10-CM | POA: Diagnosis not present

## 2018-11-25 ENCOUNTER — Ambulatory Visit
Admission: RE | Admit: 2018-11-25 | Discharge: 2018-11-25 | Disposition: A | Discharge status: Home / Self Care | Payer: BLUE CROSS/BLUE SHIELD | Source: Ambulatory Visit | Attending: Adult Health | Admitting: Adult Health

## 2018-11-25 DIAGNOSIS — Z1231 Encounter for screening mammogram for malignant neoplasm of breast: Secondary | ICD-10-CM

## 2018-12-02 DIAGNOSIS — M7060 Trochanteric bursitis, unspecified hip: Secondary | ICD-10-CM | POA: Diagnosis not present

## 2018-12-02 DIAGNOSIS — S338XXA Sprain of other parts of lumbar spine and pelvis, initial encounter: Secondary | ICD-10-CM | POA: Diagnosis not present

## 2018-12-06 DIAGNOSIS — S338XXA Sprain of other parts of lumbar spine and pelvis, initial encounter: Secondary | ICD-10-CM | POA: Diagnosis not present

## 2018-12-06 DIAGNOSIS — M7060 Trochanteric bursitis, unspecified hip: Secondary | ICD-10-CM | POA: Diagnosis not present

## 2019-01-10 DIAGNOSIS — E782 Mixed hyperlipidemia: Secondary | ICD-10-CM | POA: Diagnosis not present

## 2019-01-10 DIAGNOSIS — R002 Palpitations: Secondary | ICD-10-CM | POA: Diagnosis not present

## 2019-01-22 ENCOUNTER — Telehealth: Payer: Self-pay | Admitting: Cardiology

## 2019-01-22 NOTE — Telephone Encounter (Signed)
Two days ago, patient's right side felt like she was on fire and that her armweighed 200 pounds. She said it felt like thousands of pins were sticking her whole right side of her body.    According to her Fitbit, her heart rate got up to 150 during this time. She did not go to the ED but took three baby aspirin and pain went away. Episode lasted about 30 minutes then she went straight to sleep from being so exhausted.

## 2019-01-22 NOTE — Telephone Encounter (Signed)
Discussed episode with patient below.  Offered earlier appointment than 03/03/2019 as she has not been seen since 01/10/2017.  She will need evaluation in office.  Appointment scheduled for Friday, 01/24/2019 at 1:30 with Randall An, PA in [place] office.  Advised to go to ED if this occurs again prior to her OV.  She verbalized understanding.

## 2019-02-11 ENCOUNTER — Ambulatory Visit: Payer: BLUE CROSS/BLUE SHIELD | Admitting: Student

## 2019-03-03 ENCOUNTER — Ambulatory Visit: Payer: BLUE CROSS/BLUE SHIELD | Admitting: Cardiology

## 2019-04-08 DIAGNOSIS — J309 Allergic rhinitis, unspecified: Secondary | ICD-10-CM | POA: Diagnosis not present

## 2019-04-08 DIAGNOSIS — R05 Cough: Secondary | ICD-10-CM | POA: Diagnosis not present

## 2019-04-24 DIAGNOSIS — R002 Palpitations: Secondary | ICD-10-CM | POA: Diagnosis not present

## 2019-04-24 DIAGNOSIS — E782 Mixed hyperlipidemia: Secondary | ICD-10-CM | POA: Diagnosis not present

## 2019-04-24 DIAGNOSIS — Z1389 Encounter for screening for other disorder: Secondary | ICD-10-CM | POA: Diagnosis not present

## 2019-05-09 ENCOUNTER — Ambulatory Visit: Payer: Self-pay | Admitting: Cardiology

## 2019-07-28 NOTE — Progress Notes (Signed)
Cardiology Office Note  Date: 07/29/2019   ID: Maria SchlatterBarbara Armstrong Khan, DOB 06/23/1961, MRN [id number]  PCP:  Royann ShiversSkillman, Katherine E, PA-C  Cardiologist:  Nona DellSamuel Xyler Terpening, MD Electrophysiologist:  None   Chief Complaint  Patient presents with  . Cardiac follow-up    History of Present Illness: Maria Khan is a 58 y.o. female not seen since February 2018.  She presents for a follow-up visit.  She tells me that she has been under a lot of stress, worried about COVID-19.  She does not report any major sense of palpitations.  She states that she "hurts all over" and thinks that it might be related to lisinopril.  She states that she has read online about this.  I asked her to talk with her PCP, could potentially consider an empiric switch to Cozaar.  She also reports neuropathic right arm pain and sciatica pain.  She is currently seeing a Landchiropractor.  I personally reviewed her ECG today which shows normal sinus rhythm. She remains on Corgard which she was placed on years ago for management of palpitations in the setting of reported mitral valve prolapse.  She has remained comfortable on this medication and we have not made any adjustments.  Her follow-up echocardiogram from January 2018 did not demonstrate frank mitral valve prolapse with only trivial mitral regurgitation.  Past Medical History:  Diagnosis Date  . Dry eyes   . Essential hypertension   . GERD (gastroesophageal reflux disease)   . Menopause 05/20/2014  . Mitral valve prolapse   . Obesity   . On postmenopausal hormone replacement therapy     Past Surgical History:  Procedure Laterality Date  . DILATION AND CURETTAGE OF UTERUS    . WISDOM TOOTH EXTRACTION      Current Outpatient Medications  Medication Sig Dispense Refill  . ALPRAZolam (XANAX) 0.5 MG tablet Take 0.5 mg by mouth 3 (three) times daily.     Marland Kitchen. aspirin EC 81 MG tablet Take 81 mg by mouth as needed. For chest pains    . COMBIPATCH  0.05-0.14 MG/DAY APPLY 1 PATCH EXTERNALLY TO THE SKIN 2 TIMES A WEEK (Patient taking differently: once a week. ) 8 patch 12  . esomeprazole (NEXIUM) 40 MG capsule Take 40 mg by mouth as needed.     Marland Kitchen. ibuprofen (ADVIL,MOTRIN) 200 MG tablet Take 800 mg by mouth as needed. For pain    . lisinopril (PRINIVIL,ZESTRIL) 5 MG tablet Take 5 mg by mouth every morning.     . nadolol (CORGARD) 40 MG tablet Take 40 mg by mouth at bedtime.     Bertram Gala. Polyethyl Glycol-Propyl Glycol (SYSTANE) 0.4-0.3 % GEL Apply 1 drop to eye at bedtime.    . Triamcinolone Acetonide (NASACORT ALLERGY 24HR NA) Place into the nose as needed.     No current facility-administered medications for this visit.    Allergies:  Codeine   Social History: The patient  reports that she has never smoked. She has never used smokeless tobacco. She reports that she does not drink alcohol or use drugs.   ROS:  Please see the history of present illness. Otherwise, complete review of systems is positive for none.  All other systems are reviewed and negative.   Physical Exam: VS:  BP 109/74   Pulse 67   Temp 98.6 F (37 C)   Ht 5\' 1"  (1.549 m)   SpO2 97%   BMI 34.77 kg/m , BMI Body mass index is 34.77 kg/m.  Wt Readings  from Last 3 Encounters:  05/23/17 184 lb (83.5 kg)  01/27/17 176 lb (79.8 kg)  01/10/17 185 lb (83.9 kg)    General: Patient appears comfortable at rest. HEENT: Conjunctiva and lids normal, wearing a mask. Neck: Supple, no elevated JVP or carotid bruits, no thyromegaly. Lungs: Clear to auscultation, nonlabored breathing at rest. Cardiac: Regular rate and rhythm, no S3 or significant systolic murmur, no pericardial rub. Abdomen: Soft, nontender, bowel sounds present. Extremities: No pitting edema, distal pulses 2+. Skin: Warm and dry. Musculoskeletal: No kyphosis. Neuropsychiatric: Alert and oriented x3, affect grossly appropriate.  ECG:  An ECG dated 01/27/2017 was personally reviewed today and demonstrated:  Sinus  rhythm with low voltage.  Recent Labwork:  November 2017: Hemoglobin 13.2, platelets 254, BUN 11, creatinine 0.6, potassium 4.0, AST 14, ALT 21, TSH 1.9  Other Studies Reviewed Today:  Echocardiogram 12/14/2016: Study Conclusions  - Left ventricle: The cavity size was normal. Wall thickness was at the upper limits of normal. Systolic function was normal. The estimated ejection fraction was in the range of 60% to 65%. Wall motion was normal; there were no regional wall motion abnormalities. - Mitral valve: There was trivial regurgitation. - Right atrium: Central venous pressure (est): 3 mm Hg. - Atrial septum: No defect or patent foramen ovale was identified. - Tricuspid valve: There was trivial regurgitation. - Pulmonary arteries: PA peak pressure: 13 mm Hg (S). - Pericardium, extracardiac: A trivial pericardial effusion was identified posterior to the heart.  Impressions:  - Upper normal LV wall thickness with LVEF 60-65% and grossly normal diastolic function. No clear echocardiographic evidence of mitral valve prolapse. There is trivial mitral regurgitation and tricuspid regurgitation. Normal estimated PASP. Trivial posterior pericardial effusion.  Event monitor January 2018: Seven-day cardiac monitor reviewed. Sinus rhythm was noted throughout with heart rate ranging from 43 bpm up to 111 bpm, average heart rate 66 bpm. No significant arrhythmias or pauses.  Assessment and Plan:  1.  History of palpitations.  No significant arrhythmias or major ectopy by follow-up monitoring in 2018.  She remains comfortable on Corgard which she has taken for years.  LVEF normal by echocardiogram in 2018 and no definitive evidence of diagnostic mitral valve prolapse at that time.  2.  Essential hypertension.  She continues on lisinopril.  She is concerned about possible side effects.  I have asked her to speak with her PCP.  Could consider an empiric switched to Cozaar.   3.  Reported history of mitral valve prolapse.  Medication Adjustments/Labs and Tests Ordered: Current medicines are reviewed at length with the patient today.  Concerns regarding medicines are outlined above.   Tests Ordered: Orders Placed This Encounter  Procedures  . EKG 12-Lead    Medication Changes: No orders of the defined types were placed in this encounter.   Disposition:  Follow up 1 year in the [place] office.  Signed, Satira Sark, MD, [facility] 07/29/2019 11:06 AM    [place] [facility] [place], [place], [place] [address] Phone: [phone]; Fax: [phone]

## 2019-07-29 ENCOUNTER — Ambulatory Visit: Payer: Commercial Managed Care - PPO | Admitting: Cardiology

## 2019-07-29 ENCOUNTER — Encounter: Payer: Self-pay | Admitting: Cardiology

## 2019-07-29 ENCOUNTER — Other Ambulatory Visit: Payer: Self-pay

## 2019-07-29 VITALS — BP 109/74 | HR 67 | Temp 98.6°F | Ht 61.0 in

## 2019-07-29 DIAGNOSIS — R002 Palpitations: Secondary | ICD-10-CM

## 2019-07-29 DIAGNOSIS — I10 Essential (primary) hypertension: Secondary | ICD-10-CM

## 2019-07-29 DIAGNOSIS — Z8679 Personal history of other diseases of the circulatory system: Secondary | ICD-10-CM | POA: Diagnosis not present

## 2019-07-29 NOTE — Patient Instructions (Addendum)

## 2019-09-11 ENCOUNTER — Other Ambulatory Visit: Payer: Self-pay | Admitting: Adult Health

## 2019-09-11 DIAGNOSIS — Z1231 Encounter for screening mammogram for malignant neoplasm of breast: Secondary | ICD-10-CM

## 2019-09-30 ENCOUNTER — Telehealth: Payer: Self-pay | Admitting: Adult Health

## 2019-09-30 ENCOUNTER — Other Ambulatory Visit: Payer: Self-pay | Admitting: Adult Health

## 2019-09-30 MED ORDER — COMBIPATCH 0.05-0.14 MG/DAY TD PTTW: MEDICATED_PATCH | TRANSDERMAL | 12 refills | Status: DC

## 2019-09-30 NOTE — Telephone Encounter (Signed)
Pt aware that refill sent, may have to ask cash price, but try to get preferred meds from insurance company where husband changed jobs

## 2019-09-30 NOTE — Telephone Encounter (Signed)
-----   Message from Octaviano Glow, RN sent at 09/30/2019 11:54 AM EDT ----- Combipatch.  I'm sorry I forgot to add that. ----- Message ----- From: Estill Dooms, NP Sent: 09/30/2019  11:40 AM EDT To: Octaviano Glow, RN  On [address], what is the medication

## 2019-09-30 NOTE — Progress Notes (Signed)
Will refill combipatch

## 2019-12-12 ENCOUNTER — Ambulatory Visit: Payer: Commercial Managed Care - PPO

## 2020-01-15 DIAGNOSIS — E782 Mixed hyperlipidemia: Secondary | ICD-10-CM | POA: Diagnosis not present

## 2020-01-15 DIAGNOSIS — I10 Essential (primary) hypertension: Secondary | ICD-10-CM | POA: Diagnosis not present

## 2020-01-15 DIAGNOSIS — F419 Anxiety disorder, unspecified: Secondary | ICD-10-CM | POA: Diagnosis not present

## 2020-01-15 DIAGNOSIS — R002 Palpitations: Secondary | ICD-10-CM | POA: Diagnosis not present

## 2020-01-16 ENCOUNTER — Other Ambulatory Visit: Payer: Self-pay

## 2020-01-16 ENCOUNTER — Ambulatory Visit
Admission: RE | Admit: 2020-01-16 | Discharge: 2020-01-16 | Disposition: A | Discharge status: Home / Self Care | Payer: Commercial Managed Care - PPO | Source: Ambulatory Visit | Attending: Adult Health | Admitting: Adult Health

## 2020-01-16 DIAGNOSIS — Z1231 Encounter for screening mammogram for malignant neoplasm of breast: Secondary | ICD-10-CM | POA: Diagnosis not present

## 2020-03-30 DIAGNOSIS — Z20822 Contact with and (suspected) exposure to covid-19: Secondary | ICD-10-CM | POA: Diagnosis not present

## 2020-03-30 DIAGNOSIS — Z20828 Contact with and (suspected) exposure to other viral communicable diseases: Secondary | ICD-10-CM | POA: Diagnosis not present

## 2020-03-30 DIAGNOSIS — Z6834 Body mass index (BMI) 34.0-34.9, adult: Secondary | ICD-10-CM | POA: Diagnosis not present

## 2020-04-16 DIAGNOSIS — I10 Essential (primary) hypertension: Secondary | ICD-10-CM | POA: Diagnosis not present

## 2020-04-16 DIAGNOSIS — R002 Palpitations: Secondary | ICD-10-CM | POA: Diagnosis not present

## 2020-04-16 DIAGNOSIS — E782 Mixed hyperlipidemia: Secondary | ICD-10-CM | POA: Diagnosis not present

## 2020-04-16 DIAGNOSIS — F419 Anxiety disorder, unspecified: Secondary | ICD-10-CM | POA: Diagnosis not present

## 2020-07-22 ENCOUNTER — Telehealth: Payer: Self-pay | Admitting: Adult Health

## 2020-07-22 NOTE — Telephone Encounter (Signed)
Attempted PA for combipatch, waiting for response. Will inform patient once determination is received.

## 2020-07-22 NOTE — Telephone Encounter (Signed)
Patient called stating that Victorino Dike use to prescribed her a patch for her hot flashes and during the COVID they have brought up here price to $700, pt states that she has not taken them for 6 month and now she is literarily dying. Pt states that if Victorino Dike could call the number provider it will bring it down to  $140 a month [phone]. Please contact pt

## 2020-07-23 MED ORDER — ESTRADIOL-LEVONORGESTREL 0.045-0.015 MG/DAY TD PTWK: 1.0000 | MEDICATED_PATCH | TRANSDERMAL | 3 refills | Status: DC

## 2020-07-23 NOTE — Telephone Encounter (Signed)
PA denied for Combipatch. Approved med is Climara patch.

## 2020-07-23 NOTE — Telephone Encounter (Signed)
Pt aware that insurance denied combipatch , will cover climara, will rx climara pro

## 2020-07-23 NOTE — Addendum Note (Signed)
Addended by: Cyril Mourning A on: 07/23/2020 10:12 AM   Modules accepted: Orders

## 2020-08-13 ENCOUNTER — Other Ambulatory Visit: Payer: Self-pay | Admitting: Adult Health

## 2020-09-06 ENCOUNTER — Encounter: Payer: Self-pay | Admitting: Adult Health

## 2020-09-06 ENCOUNTER — Ambulatory Visit (INDEPENDENT_AMBULATORY_CARE_PROVIDER_SITE_OTHER): Payer: BC Managed Care – PPO | Admitting: Adult Health

## 2020-09-06 ENCOUNTER — Other Ambulatory Visit (HOSPITAL_COMMUNITY)
Admission: RE | Admit: 2020-09-06 | Discharge: 2020-09-06 | Disposition: A | Discharge status: Home / Self Care | Payer: BC Managed Care – PPO | Source: Ambulatory Visit | Attending: Adult Health | Admitting: Adult Health

## 2020-09-06 VITALS — BP 117/85 | HR 65 | Ht 61.5 in | Wt 200.0 lb

## 2020-09-06 DIAGNOSIS — Z01419 Encounter for gynecological examination (general) (routine) without abnormal findings: Secondary | ICD-10-CM | POA: Diagnosis not present

## 2020-09-06 DIAGNOSIS — Z1211 Encounter for screening for malignant neoplasm of colon: Secondary | ICD-10-CM

## 2020-09-06 DIAGNOSIS — Z79890 Hormone replacement therapy (postmenopausal): Secondary | ICD-10-CM

## 2020-09-06 LAB — HEMOCCULT GUIAC POC 1CARD (OFFICE): Fecal Occult Blood, POC: NEGATIVE

## 2020-09-06 NOTE — Progress Notes (Signed)
Patient ID: Maria Khan, female   DOB: 1961-10-17, 59 y.o.   MRN: [id number] History of Present Illness: Maria Khan is a 59 year old white female, married, PM in for a well woman gyn exam and pap.She is on climara pro for her hot flashes. PCP is Roma Kayser PA    Current Medications, Allergies, Past Medical History, Past Surgical History, Family History and Social History were reviewed in [employer] record.     Review of Systems: Patient denies any headaches, hearing loss, fatigue, blurred vision, shortness of breath, chest pain, abdominal pain, problems with bowel movements, urination, or intercourse.(not having sex). No joint pain or mood swings. Has body aches at times, and had hot flashes when off the patch. Has gained weight in the last year   Physical Exam:BP 117/85 (BP Location: Left Arm, Patient Position: Sitting, Cuff Size: Normal)   Pulse 65   Ht 5' 1.5" (1.562 m)   Wt 200 lb (90.7 kg)   BMI 37.18 kg/m  General:  Well developed, well nourished, no acute distress Skin:  Warm and dry Neck:  Midline trachea, normal thyroid, good ROM, no lymphadenopathy Lungs; Clear to auscultation bilaterally Breast:  No dominant palpable mass, retraction, or nipple discharge Cardiovascular: Regular rate and rhythm Abdomen:  Soft, non tender, no hepatosplenomegaly Pelvic:  External genitalia is normal in appearance, no lesions.  The vagina is normal in appearance. Urethra has no lesions or masses. The cervix is bulbous.Pap with high risk HPV genotyping performed.  Uterus is felt to be normal size, shape, and contour.  No adnexal masses or tenderness noted.Bladder is non tender, no masses felt. Rectal: Good sphincter tone, no polyps, or hemorrhoids felt.  Hemoccult negative. Extremities/musculoskeletal:  No swelling or varicosities noted, no clubbing or cyanosis Psych:  No mood changes, alert and cooperative,seems happy AA is 0 Fall risk is low PHQ 9 score is  7, no SI, uses xanax prn  Upstream - 09/06/20 0932      Pregnancy Intention Screening   Does the patient want to become pregnant in the next year? N/A   post menopausal   Does the patient's partner want to become pregnant in the next year? N/A    Would the patient like to discuss contraceptive options today? N/A      Contraception Wrap Up   Current Method --   post menopausal   End Method --   post menopausal   Contraception Counseling Provided No         Examination chaperoned by Nance Pear LPN  Impression and Plan:  1. Encounter for gynecological examination with Papanicolaou smear of cervix Pap sent Physical in 1 year Pap in 3 if normal Mammogram every 2 years Labs with PCP She declines colonoscopy   2. Encounter for screening fecal occult blood testing  3. Hormone replacement therapy (HRT) Continue climara pro, has refills

## 2020-09-07 LAB — CYTOLOGY - PAP
Comment: NEGATIVE
Diagnosis: NEGATIVE
High risk HPV: NEGATIVE

## 2020-09-13 DIAGNOSIS — F419 Anxiety disorder, unspecified: Secondary | ICD-10-CM | POA: Diagnosis not present

## 2020-09-13 DIAGNOSIS — Z6836 Body mass index (BMI) 36.0-36.9, adult: Secondary | ICD-10-CM | POA: Diagnosis not present

## 2020-09-13 DIAGNOSIS — M25561 Pain in right knee: Secondary | ICD-10-CM | POA: Diagnosis not present

## 2020-09-15 ENCOUNTER — Other Ambulatory Visit: Payer: Self-pay

## 2020-09-15 ENCOUNTER — Other Ambulatory Visit (HOSPITAL_COMMUNITY): Payer: Self-pay | Admitting: Physician Assistant

## 2020-09-15 ENCOUNTER — Ambulatory Visit (HOSPITAL_COMMUNITY)
Admission: RE | Admit: 2020-09-15 | Discharge: 2020-09-15 | Disposition: A | Discharge status: Home / Self Care | Payer: BC Managed Care – PPO | Source: Ambulatory Visit | Attending: Physician Assistant | Admitting: Physician Assistant

## 2020-09-15 DIAGNOSIS — M7989 Other specified soft tissue disorders: Secondary | ICD-10-CM | POA: Insufficient documentation

## 2020-09-15 DIAGNOSIS — Z6836 Body mass index (BMI) 36.0-36.9, adult: Secondary | ICD-10-CM | POA: Diagnosis not present

## 2020-09-15 DIAGNOSIS — M25561 Pain in right knee: Secondary | ICD-10-CM | POA: Diagnosis not present

## 2020-09-20 DIAGNOSIS — Z6836 Body mass index (BMI) 36.0-36.9, adult: Secondary | ICD-10-CM | POA: Diagnosis not present

## 2020-09-20 DIAGNOSIS — M25561 Pain in right knee: Secondary | ICD-10-CM | POA: Diagnosis not present

## 2020-09-23 ENCOUNTER — Encounter: Payer: Self-pay | Admitting: Orthopaedic Surgery

## 2020-09-23 ENCOUNTER — Other Ambulatory Visit: Payer: Self-pay

## 2020-09-23 ENCOUNTER — Ambulatory Visit (INDEPENDENT_AMBULATORY_CARE_PROVIDER_SITE_OTHER): Payer: BC Managed Care – PPO | Admitting: Orthopaedic Surgery

## 2020-09-23 DIAGNOSIS — M25561 Pain in right knee: Secondary | ICD-10-CM | POA: Diagnosis not present

## 2020-09-23 NOTE — Progress Notes (Signed)
Office Visit Note   Patient: Maria Khan           Date of Birth: 11-Aug-1961           MRN: [id number] Visit Date: 09/23/2020              Requested by: Royann Shivers, PA-C [address] [place],  [place] [address] PCP: Royann Shivers, PA-C   Assessment & Plan: Visit Diagnoses:  1. Acute pain of right knee     Plan: Patient requested a work note no work x1 week she has some sick time she feels like the knee is getting better we discussed possibly intra-articular injection versus MRI scan to rule out posterior medial meniscal tear which may have happened with her extension step back injury.  Ligamentous exam is normal.  We will check her in 1 week.  Follow-Up Instructions: Return in about 1 week (around 09/30/2020).   Orders:  No orders of the defined types were placed in this encounter.  No orders of the defined types were placed in this encounter.     Procedures: No procedures performed   Clinical Data: No additional findings.   Subjective: Chief Complaint  Patient presents with  . Right Knee - Pain    HPI 59 year old female seen with right knee pain.  She states she was diagnosed with likely a Baker's cyst.  She felt a pop when she stepped backwards.  She was placed on a steroid Dosepak which helped some she also took ibuprofen 800 mg after that.  She states the knee is somewhat better but if she stands for long period time it painful.  She has had some difficulty walking feels better if her knee is straight.  Venous Doppler negative for DVT done on 09/15/2020 and no Baker's cyst was noted. xrays were normal for right knee.  Review of Systems all other systems are noncontributory is a contribute HPI.   Objective: Vital Signs: Ht 5\' 1"  (1.549 m)   Wt 195 lb (88.5 kg)   BMI 36.84 kg/m   Physical Exam Constitutional:      Appearance: She is well-developed.  HENT:     Head: Normocephalic.     Right Ear: External ear normal.     Left Ear:  External ear normal.  Eyes:     Pupils: Pupils are equal, round, and reactive to light.  Neck:     Thyroid: No thyromegaly.     Trachea: No tracheal deviation.  Cardiovascular:     Rate and Rhythm: Normal rate.  Pulmonary:     Effort: Pulmonary effort is normal.  Abdominal:     Palpations: Abdomen is soft.  Skin:    General: Skin is warm and dry.  Neurological:     Mental Status: She is alert and oriented to person, place, and time.  Psychiatric:        Behavior: Behavior normal.     Ortho Exam patient has negative logroll the hip she is ambulates with decreased knee flexion.  No palpable Baker's cyst collateral crucial ligament exam is normal.  She has medial joint line tenderness posterior to the MCL.  Knee and ankle jerk are intact she is able to heel and toe walk with some posterior knee pain. Specialty Comments:  No specialty comments available.  Imaging: No results found.   PMFS History: Patient Active Problem List   Diagnosis Date Noted  . Pain in right knee 09/26/2020  . Encounter for screening fecal  occult blood testing 09/06/2020  . Encounter for gynecological examination with Papanicolaou smear of cervix 09/06/2020  . PMB (postmenopausal bleeding) 09/01/2015  . Stress at home 09/01/2015  . Back pain 05/20/2014  . Sleep disturbance 05/20/2014  . Menopause 05/20/2014  . Hormone replacement therapy (HRT) 05/20/2014   Past Medical History:  Diagnosis Date  . Dry eyes   . Essential hypertension   . GERD (gastroesophageal reflux disease)   . Menopause 05/20/2014  . Mitral valve prolapse   . Obesity   . On postmenopausal hormone replacement therapy     Family History  Problem Relation Age of Onset  . Diabetes Mother   . Heart attack Mother   . Diabetes Sister   . Hypertension Sister   . Mitral valve prolapse Sister   . Diabetes Brother   . Hypertension Brother   . Diabetes Maternal Grandmother   . Diabetes Sister   . Hypertension Sister   . Diabetes  Sister   . Hypertension Sister   . Diabetes Sister   . Hypertension Sister   . Melanoma Father     Past Surgical History:  Procedure Laterality Date  . DILATION AND CURETTAGE OF UTERUS    . WISDOM TOOTH EXTRACTION     Social History   Occupational History  . Not on file  Tobacco Use  . Smoking status: Never Smoker  . Smokeless tobacco: Never Used  Vaping Use  . Vaping Use: Never used  Substance and Sexual Activity  . Alcohol use: No  . Drug use: No  . Sexual activity: Not Currently    Birth control/protection: Post-menopausal

## 2020-09-26 DIAGNOSIS — M25561 Pain in right knee: Secondary | ICD-10-CM | POA: Insufficient documentation

## 2020-09-30 ENCOUNTER — Encounter: Payer: Self-pay | Admitting: Orthopaedic Surgery

## 2020-09-30 ENCOUNTER — Ambulatory Visit (INDEPENDENT_AMBULATORY_CARE_PROVIDER_SITE_OTHER): Payer: BC Managed Care – PPO | Admitting: Orthopaedic Surgery

## 2020-09-30 ENCOUNTER — Other Ambulatory Visit: Payer: Self-pay

## 2020-09-30 VITALS — Ht 61.0 in | Wt 195.0 lb

## 2020-09-30 DIAGNOSIS — M25561 Pain in right knee: Secondary | ICD-10-CM | POA: Diagnosis not present

## 2020-09-30 NOTE — Progress Notes (Signed)
Office Visit Note   Patient: Maria Khan           Date of Birth: 09-Sep-1961           MRN: [id number] Visit Date: 09/30/2020              Requested by: Royann Shivers, PA-C [address] [place],  [place] [address] PCP: Royann Shivers, PA-C   Assessment & Plan: Visit Diagnoses:  1. Acute pain of right knee     Plan: Intra-articular injection performed right knee which she tolerated well she had slight improvement with ambulation. She will call back and we discussed she likely needs an MRI scan that she likely has posterior medial meniscal tear with recurrent symptoms. Work slip given keeping her out of work for 1 more week. If she is not better she will need an MRI scan of her knee.  Follow-Up Instructions: No follow-ups on file.   Orders:  Orders Placed This Encounter  Procedures  . Large Joint Inj   No orders of the defined types were placed in this encounter.     Procedures: Large Joint Inj: R knee on 10/01/2020 10:55 AM Indications: pain and joint swelling Details: 22 G 1.5 in needle, anterolateral approach  Arthrogram: No  Medications: 40 mg methylPREDNISolone acetate 40 MG/ML; 0.5 mL lidocaine 1 %; 4 mL bupivacaine 0.25 % Outcome: tolerated well, no immediate complications Procedure, treatment alternatives, risks and benefits explained, specific risks discussed. Consent was given by the patient. Immediately prior to procedure a time out was called to verify the correct patient, procedure, equipment, support staff and site/side marked as required. Patient was prepped and draped in the usual sterile fashion.       Clinical Data: No additional findings.   Subjective: Chief Complaint  Patient presents with  . Right Knee - Pain, Follow-up    HPI 59 year old female seen with 1 week history of right knee pain. Patient been wearing a brace she got on [employer] and states she has pain medially. When she walks she has had popping in her knee and  states she has noticed fullness in the back of her knee with swelling. It bothers her on a daily basis and she has had difficulty working and walking.  Review of Systems  14 point update unchanged from 09/23/2020.  Objective: Vital Signs: Ht 5\' 1"  (1.549 m)   Wt 195 lb (88.5 kg)   BMI 36.84 kg/m   Physical Exam Constitutional:      Appearance: She is well-developed.  HENT:     Head: Normocephalic.     Right Ear: External ear normal.     Left Ear: External ear normal.  Eyes:     Pupils: Pupils are equal, round, and reactive to light.  Neck:     Thyroid: No thyromegaly.     Trachea: No tracheal deviation.  Cardiovascular:     Rate and Rhythm: Normal rate.  Pulmonary:     Effort: Pulmonary effort is normal.  Abdominal:     Palpations: Abdomen is soft.  Skin:    General: Skin is warm and dry.  Neurological:     Mental Status: She is alert and oriented to person, place, and time.  Psychiatric:        Behavior: Behavior normal.     Ortho Exam patient has right knee medial joint line tenderness negative logroll the hips some medial and posterior medial pain with hyperextension. She ambulates with the right knee limp.  At times with extension she feels sharp pain posterior medial. Negative Homan distal pulses are intact anterior tib gastrocsoleus is normal trochanteric bursal palpation is normal.  Specialty Comments:  No specialty comments available.  Imaging: No results found.   PMFS History: Patient Active Problem List   Diagnosis Date Noted  . Pain in right knee 09/26/2020  . Encounter for screening fecal occult blood testing 09/06/2020  . Encounter for gynecological examination with Papanicolaou smear of cervix 09/06/2020  . PMB (postmenopausal bleeding) 09/01/2015  . Stress at home 09/01/2015  . Back pain 05/20/2014  . Sleep disturbance 05/20/2014  . Menopause 05/20/2014  . Hormone replacement therapy (HRT) 05/20/2014   Past Medical History:  Diagnosis Date    . Dry eyes   . Essential hypertension   . GERD (gastroesophageal reflux disease)   . Menopause 05/20/2014  . Mitral valve prolapse   . Obesity   . On postmenopausal hormone replacement therapy     Family History  Problem Relation Age of Onset  . Diabetes Mother   . Heart attack Mother   . Diabetes Sister   . Hypertension Sister   . Mitral valve prolapse Sister   . Diabetes Brother   . Hypertension Brother   . Diabetes Maternal Grandmother   . Diabetes Sister   . Hypertension Sister   . Diabetes Sister   . Hypertension Sister   . Diabetes Sister   . Hypertension Sister   . Melanoma Father     Past Surgical History:  Procedure Laterality Date  . DILATION AND CURETTAGE OF UTERUS    . WISDOM TOOTH EXTRACTION     Social History   Occupational History  . Not on file  Tobacco Use  . Smoking status: Never Smoker  . Smokeless tobacco: Never Used  Vaping Use  . Vaping Use: Never used  Substance and Sexual Activity  . Alcohol use: No  . Drug use: No  . Sexual activity: Not Currently    Birth control/protection: Post-menopausal

## 2020-10-01 DIAGNOSIS — M25561 Pain in right knee: Secondary | ICD-10-CM

## 2020-10-01 MED ORDER — BUPIVACAINE HCL 0.25 % IJ SOLN
4.0000 mL | INTRAMUSCULAR | Status: AC | PRN
  Administered 2020-10-01: 4 mL via INTRA_ARTICULAR

## 2020-10-01 MED ORDER — METHYLPREDNISOLONE ACETATE 40 MG/ML IJ SUSP
40.0000 mg | INTRAMUSCULAR | Status: AC | PRN
  Administered 2020-10-01: 40 mg via INTRA_ARTICULAR

## 2020-10-01 MED ORDER — LIDOCAINE HCL 1 % IJ SOLN
0.5000 mL | INTRAMUSCULAR | Status: AC | PRN
  Administered 2020-10-01: .5 mL

## 2020-10-11 ENCOUNTER — Telehealth: Payer: Self-pay | Admitting: Radiology

## 2020-10-11 DIAGNOSIS — M25561 Pain in right knee: Secondary | ICD-10-CM

## 2020-10-11 NOTE — Telephone Encounter (Signed)
Patient is ready to proceed with MRI and would like it scheduled at [facility]. OK to order?

## 2020-10-11 NOTE — Telephone Encounter (Signed)
Order entered

## 2020-10-11 NOTE — Telephone Encounter (Signed)
Yes - - OK - thank you

## 2020-10-11 NOTE — Addendum Note (Signed)
Addended by: Rogers Seeds on: 10/11/2020 11:11 AM   Modules accepted: Orders

## 2020-10-13 ENCOUNTER — Telehealth: Payer: Self-pay | Admitting: Radiology

## 2020-10-13 NOTE — Telephone Encounter (Signed)
I left voicemail for patient that note can be picked up in [place] office tomorrow after 10am. Also advised of Aleve bid with food.   Eldred Manges, MD  Marybelle Killings, RT OK for note. Thanks . Could she be on motrin 800mg  not tylenol??  Could try aleve with food thanks       Previous Messages   ----- Message -----  From: , RT  Sent: 10/13/2020  2:52 PM EST  To: 13/09/2020, MD   Please advise.  ----- Message -----  From: Eldred Manges  Sent: 10/13/2020  2:45 PM EST  To: 13/09/2020, RT   Tanikka is scheduled for her MRI 10/30/20. She is scheduled for follow-up Dec.2. She is unable to work and needs a note to stay out of work until she comes in for MRI review. Will Dr. 11/01/20 give this to her or does she need to get this from her PCP?  She also would like to know what she can take for the pain? She has 800mg  of tylenol but it makes her sick;   She can be reached at [phone]

## 2020-10-21 ENCOUNTER — Ambulatory Visit: Payer: BC Managed Care – PPO | Admitting: Orthopaedic Surgery

## 2020-10-22 DIAGNOSIS — Z0000 Encounter for general adult medical examination without abnormal findings: Secondary | ICD-10-CM | POA: Diagnosis not present

## 2020-10-22 DIAGNOSIS — Z1389 Encounter for screening for other disorder: Secondary | ICD-10-CM | POA: Diagnosis not present

## 2020-10-22 DIAGNOSIS — Z1331 Encounter for screening for depression: Secondary | ICD-10-CM | POA: Diagnosis not present

## 2020-10-30 ENCOUNTER — Ambulatory Visit
Admission: RE | Admit: 2020-10-30 | Discharge: 2020-10-30 | Disposition: A | Discharge status: Home / Self Care | Payer: BC Managed Care – PPO | Source: Ambulatory Visit | Attending: Orthopaedic Surgery | Admitting: Orthopaedic Surgery

## 2020-10-30 ENCOUNTER — Other Ambulatory Visit: Payer: Self-pay

## 2020-10-30 DIAGNOSIS — M25561 Pain in right knee: Secondary | ICD-10-CM | POA: Diagnosis not present

## 2020-11-04 ENCOUNTER — Ambulatory Visit (INDEPENDENT_AMBULATORY_CARE_PROVIDER_SITE_OTHER): Payer: BC Managed Care – PPO | Admitting: Orthopaedic Surgery

## 2020-11-04 ENCOUNTER — Encounter: Payer: Self-pay | Admitting: Orthopaedic Surgery

## 2020-11-04 ENCOUNTER — Other Ambulatory Visit: Payer: Self-pay

## 2020-11-04 VITALS — Ht 61.0 in | Wt 195.0 lb

## 2020-11-04 DIAGNOSIS — M25561 Pain in right knee: Secondary | ICD-10-CM

## 2020-11-04 NOTE — Progress Notes (Signed)
Office Visit Note   Patient: Maria Khan           Date of Birth: [birth year]           MRN: [id number] Visit Date: 11/04/2020              Requested by: Royann Shivers, PA-C [address] [place],  [place] [address] PCP: Royann Shivers, PA-C   Assessment & Plan: Visit Diagnoses:  1. Right knee pain, unspecified chronicity     Plan: Reviewed the MRI gave her a copy of the report she had already looked at it online and was concerned that she would have terrible problems with her knee.  She does have posterior medial meniscal tear and some extrusion of the meniscus.  There appears to be acute subchondral fracture periphery of the medial tibial plateau area with surrounding bone edema.  Grade 1 MCL sprain which is normal on physical exam.  Patient does have some full-thickness cartilage loss along the medial patellar facet.  Work slip given for 4 hrs per day times 2  weeks then she can gradually increase as tolerated after that to full shifts.  We discussed the bone contusion is giving her principal problem not the degenerative posterior medial meniscus.  This should gradually improve.  She can ride an exercise bike avoid doing excessive walking especially can and recheck 2 months.  Follow-Up Instructions: Return in about 2 months (around 01/05/2021).   Orders:  No orders of the defined types were placed in this encounter.  No orders of the defined types were placed in this encounter.     Procedures: No procedures performed   Clinical Data: No additional findings.   Subjective: Chief Complaint  Patient presents with  . Right Knee - Follow-up    MRI review    HPI 59 year old female returns with ongoing problems with right knee pain primarily medial and posterior.  She had previous injection that gave her some relief but did not last.  At times she has more pain than others sometimes she can walk fairly well without limping.  She is not had any true locking.   MRI scan has been obtained and is available for review.  Review of Systems Negative for chills or fever all other systems are noncontributory.  Objective: Vital Signs: Ht 5\' 1"  (1.549 m)   Wt 195 lb (88.5 kg)   BMI 36.84 kg/m   Physical Exam Constitutional:      Appearance: She is well-developed.  HENT:     Head: Normocephalic.     Right Ear: External ear normal.     Left Ear: External ear normal.  Eyes:     Pupils: Pupils are equal, round, and reactive to light.  Neck:     Thyroid: No thyromegaly.     Trachea: No tracheal deviation.  Cardiovascular:     Rate and Rhythm: Normal rate.  Pulmonary:     Effort: Pulmonary effort is normal.  Abdominal:     Palpations: Abdomen is soft.  Skin:    General: Skin is warm and dry.  Neurological:     Mental Status: She is alert and oriented to person, place, and time.  Psychiatric:        Behavior: Behavior normal.     Ortho Exam patient has full extension she flexes to 120 degrees.  Medial collateral ligament is stable ACL PCL exam is normal she has tenderness of the medial joint line behind the medial collateral ligament  extending posteriorly.  Some tenderness of the pes bursa.  Specialty Comments:  No specialty comments available.  Imaging: CLINICAL DATA:  Right knee pain after fall 6 weeks ago  EXAM: MRI OF THE RIGHT KNEE WITHOUT CONTRAST  TECHNIQUE: Multiplanar, multisequence MR imaging of the knee was performed. No intravenous contrast was administered.  COMPARISON:  X-ray 09/15/2020  FINDINGS: MENISCI  Medial meniscus: High-grade radial tear at the posterior horn of the medial meniscus near the posterior root attachment (series 10, images 11-12; series 9, image 9). Meniscal body is medially extruded. There is intermediate intrasubstance signal within the medial meniscal body and posterior horn which may reflect intrasubstance degeneration and/or meniscal contusion.  Lateral meniscus:   Intact.  LIGAMENTS  Cruciates:  Intact ACL and PCL.  Collaterals: Intact MCL with mild periligamentous edema and small volume of MCL bursal fluid. Lateral collateral ligament complex intact.  CARTILAGE  Patellofemoral: Irregular full-thickness cartilage loss along the medial patellar facet superiorly with underlying subchondral marrow signal changes. Trochlear cartilage intact.  Medial: Cartilage thinning of the weight-bearing surfaces of the medial compartment.  Lateral:  No chondral defect.  Joint:  Small knee joint effusion.  Fat pads within normal limits.  Popliteal Fossa:  No Baker cyst. Intact popliteus tendon.  Extensor Mechanism:  Intact quadriceps tendon and patellar tendon.  Bones: Small subchondral fracture at the periphery of the medial tibial plateau with surrounding bone marrow edema (series 10, image 7). Osseous structures are otherwise within normal limits. No additional fracture. No malalignment. No suspicious bone lesion.  Other: Mild nonspecific prepatellar subcutaneous edema.  IMPRESSION: 1. High-grade radial tear at the posterior horn of the medial meniscus near the posterior root attachment. 2. Acute subchondral fracture at the periphery of the medial tibial plateau with surrounding bone marrow edema. 3. Grade 1 MCL sprain.  Mild MCL bursitis. 4. Irregular full-thickness cartilage loss of the superior aspect of the medial patellar facet. 5. Small knee joint effusion.   Electronically Signed   By: Duanne Guess D.O.   On: 10/30/2020 11:34   PMFS History: Patient Active Problem List   Diagnosis Date Noted  . Pain in right knee 09/26/2020  . Encounter for screening fecal occult blood testing 09/06/2020  . Encounter for gynecological examination with Papanicolaou smear of cervix 09/06/2020  . PMB (postmenopausal bleeding) 09/01/2015  . Stress at home 09/01/2015  . Back pain 05/20/2014  . Sleep disturbance 05/20/2014  .  Menopause 05/20/2014  . Hormone replacement therapy (HRT) 05/20/2014   Past Medical History:  Diagnosis Date  . Dry eyes   . Essential hypertension   . GERD (gastroesophageal reflux disease)   . Menopause 05/20/2014  . Mitral valve prolapse   . Obesity   . On postmenopausal hormone replacement therapy     Family History  Problem Relation Age of Onset  . Diabetes Mother   . Heart attack Mother   . Diabetes Sister   . Hypertension Sister   . Mitral valve prolapse Sister   . Diabetes Brother   . Hypertension Brother   . Diabetes Maternal Grandmother   . Diabetes Sister   . Hypertension Sister   . Diabetes Sister   . Hypertension Sister   . Diabetes Sister   . Hypertension Sister   . Melanoma Father     Past Surgical History:  Procedure Laterality Date  . DILATION AND CURETTAGE OF UTERUS    . WISDOM TOOTH EXTRACTION     Social History   Occupational History  . Not  on file  Tobacco Use  . Smoking status: Never Smoker  . Smokeless tobacco: Never Used  Vaping Use  . Vaping Use: Never used  Substance and Sexual Activity  . Alcohol use: No  . Drug use: No  . Sexual activity: Not Currently    Birth control/protection: Post-menopausal

## 2020-11-10 ENCOUNTER — Other Ambulatory Visit: Payer: Self-pay | Admitting: Adult Health

## 2020-11-19 ENCOUNTER — Telehealth: Payer: Self-pay | Admitting: Radiology

## 2020-11-19 NOTE — Telephone Encounter (Signed)
OK note extending max 4 hrs per day for 2 more weeks thanks

## 2020-11-19 NOTE — Telephone Encounter (Signed)
Please see message from [place] office below. OK for note? For how long?  Pt needs a work note to state she can only work 4 hours per day. She can be reached at [phone] if needed

## 2020-11-19 NOTE — Telephone Encounter (Signed)
Note entered. I called patient and left voicemail advising, note available in My Chart. Asked for return call with instructions if patient needs for [place] to send note for her.

## 2020-11-30 DIAGNOSIS — Z20828 Contact with and (suspected) exposure to other viral communicable diseases: Secondary | ICD-10-CM | POA: Diagnosis not present

## 2020-12-01 DIAGNOSIS — Z20828 Contact with and (suspected) exposure to other viral communicable diseases: Secondary | ICD-10-CM | POA: Diagnosis not present

## 2020-12-01 DIAGNOSIS — Z6834 Body mass index (BMI) 34.0-34.9, adult: Secondary | ICD-10-CM | POA: Diagnosis not present

## 2020-12-24 DIAGNOSIS — Z6834 Body mass index (BMI) 34.0-34.9, adult: Secondary | ICD-10-CM | POA: Diagnosis not present

## 2020-12-24 DIAGNOSIS — R059 Cough, unspecified: Secondary | ICD-10-CM | POA: Diagnosis not present

## 2021-01-06 ENCOUNTER — Encounter: Payer: Self-pay | Admitting: Orthopaedic Surgery

## 2021-01-06 ENCOUNTER — Ambulatory Visit (INDEPENDENT_AMBULATORY_CARE_PROVIDER_SITE_OTHER): Payer: BC Managed Care – PPO | Admitting: Orthopaedic Surgery

## 2021-01-06 ENCOUNTER — Other Ambulatory Visit: Payer: Self-pay

## 2021-01-06 VITALS — Ht 61.0 in | Wt 200.0 lb

## 2021-01-06 DIAGNOSIS — M25561 Pain in right knee: Secondary | ICD-10-CM

## 2021-01-06 NOTE — Progress Notes (Signed)
Office Visit Note   Patient: Maria Khan           Date of Birth: [birth year]           MRN: [id number] Visit Date: 01/06/2021              Requested by: Royann Shivers, PA-C [address] [place],  [place] [address] PCP: Royann Shivers, PA-C   Assessment & Plan: Visit Diagnoses:  1. Right knee pain, unspecified chronicity     Plan: Check patient back in 5 weeks we discussed that her injury take some time for it to heal its been 2 months.  On return 5 weeks will obtain a 2 view x-rays of her right knee.  Follow-Up Instructions: Return in about 5 weeks (around 02/10/2021).   Orders:  No orders of the defined types were placed in this encounter.  No orders of the defined types were placed in this encounter.     Procedures: No procedures performed   Clinical Data: No additional findings.   Subjective: Chief Complaint  Patient presents with  . Right Knee - Pain, Follow-up    HPI 60 year old female returns for follow-up right knee injury with hyperextension type injury when she stepped back rapidly to avoid collision with someone that was coming through the door.  In the interim since last seen she got COVID at Christmas.  She states she thinks she was doing better until she got Covid and then since then the knees seem to be bothering her worse she is having some problems with her opposite left knee.  Pain is primarily medial on the right knee where MRI scan showed bone contusion/acute subchondral fracture of the periphery of the medial tibial plateau with surrounding bone edema.  Patient had posterior medial meniscal tear.  She not having knee swelling but still has knee aching has been using different knee sleeves.  Advil does not help she is used some topical Aspercreme.  Review of Systems patient had to vaccines and not had her booster when she developed Covid at Christmas time.  Still having some problems with dry eyes aching and decreased energy to some  degree.   Objective: Vital Signs: Ht 5\' 1"  (1.549 m)   Wt 200 lb (90.7 kg)   BMI 37.79 kg/m   Physical Exam Constitutional:      Appearance: She is well-developed.  HENT:     Head: Normocephalic.     Right Ear: External ear normal.     Left Ear: External ear normal.  Eyes:     Pupils: Pupils are equal, round, and reactive to light.  Neck:     Thyroid: No thyromegaly.     Trachea: No tracheal deviation.  Cardiovascular:     Rate and Rhythm: Normal rate.  Pulmonary:     Effort: Pulmonary effort is normal.  Abdominal:     Palpations: Abdomen is soft.  Skin:    General: Skin is warm and dry.  Neurological:     Mental Status: She is alert and oriented to person, place, and time.  Psychiatric:        Mood and Affect: Mood and affect normal.        Behavior: Behavior normal.     Ortho Exam patient has medial joint line tenderness over the medial side also pes bursa.  Knee reaches full extension.  She is using compressive knee sleeve negative logroll the hip she is ambulatory without limping.  Specialty Comments:  No  specialty comments available.  Imaging: No results found.   PMFS History: Patient Active Problem List   Diagnosis Date Noted  . Pain in right knee 09/26/2020  . Encounter for screening fecal occult blood testing 09/06/2020  . Encounter for gynecological examination with Papanicolaou smear of cervix 09/06/2020  . PMB (postmenopausal bleeding) 09/01/2015  . Stress at home 09/01/2015  . Back pain 05/20/2014  . Sleep disturbance 05/20/2014  . Menopause 05/20/2014  . Hormone replacement therapy (HRT) 05/20/2014   Past Medical History:  Diagnosis Date  . Dry eyes   . Essential hypertension   . GERD (gastroesophageal reflux disease)   . Menopause 05/20/2014  . Mitral valve prolapse   . Obesity   . On postmenopausal hormone replacement therapy     Family History  Problem Relation Age of Onset  . Diabetes Mother   . Heart attack Mother   . Diabetes  Sister   . Hypertension Sister   . Mitral valve prolapse Sister   . Diabetes Brother   . Hypertension Brother   . Diabetes Maternal Grandmother   . Diabetes Sister   . Hypertension Sister   . Diabetes Sister   . Hypertension Sister   . Diabetes Sister   . Hypertension Sister   . Melanoma Father     Past Surgical History:  Procedure Laterality Date  . DILATION AND CURETTAGE OF UTERUS    . WISDOM TOOTH EXTRACTION     Social History   Occupational History  . Not on file  Tobacco Use  . Smoking status: Never Smoker  . Smokeless tobacco: Never Used  Vaping Use  . Vaping Use: Never used  Substance and Sexual Activity  . Alcohol use: No  . Drug use: No  . Sexual activity: Not Currently    Birth control/protection: Post-menopausal

## 2021-02-04 ENCOUNTER — Other Ambulatory Visit: Payer: Self-pay | Admitting: Adult Health

## 2021-02-10 ENCOUNTER — Ambulatory Visit: Payer: BC Managed Care – PPO | Admitting: Orthopaedic Surgery

## 2021-02-11 DIAGNOSIS — H0288A Meibomian gland dysfunction right eye, upper and lower eyelids: Secondary | ICD-10-CM | POA: Diagnosis not present

## 2021-02-11 DIAGNOSIS — H11002 Unspecified pterygium of left eye: Secondary | ICD-10-CM | POA: Diagnosis not present

## 2021-02-11 DIAGNOSIS — H2513 Age-related nuclear cataract, bilateral: Secondary | ICD-10-CM | POA: Diagnosis not present

## 2021-02-11 DIAGNOSIS — L718 Other rosacea: Secondary | ICD-10-CM | POA: Diagnosis not present

## 2021-02-18 DIAGNOSIS — F419 Anxiety disorder, unspecified: Secondary | ICD-10-CM | POA: Diagnosis not present

## 2021-02-18 DIAGNOSIS — I10 Essential (primary) hypertension: Secondary | ICD-10-CM | POA: Diagnosis not present

## 2021-02-18 DIAGNOSIS — R002 Palpitations: Secondary | ICD-10-CM | POA: Diagnosis not present

## 2021-02-18 DIAGNOSIS — E7849 Other hyperlipidemia: Secondary | ICD-10-CM | POA: Diagnosis not present

## 2021-03-10 DIAGNOSIS — E782 Mixed hyperlipidemia: Secondary | ICD-10-CM | POA: Diagnosis not present

## 2021-03-10 DIAGNOSIS — E7849 Other hyperlipidemia: Secondary | ICD-10-CM | POA: Diagnosis not present

## 2021-03-10 DIAGNOSIS — R002 Palpitations: Secondary | ICD-10-CM | POA: Diagnosis not present

## 2021-03-10 DIAGNOSIS — I10 Essential (primary) hypertension: Secondary | ICD-10-CM | POA: Diagnosis not present

## 2021-03-10 DIAGNOSIS — F419 Anxiety disorder, unspecified: Secondary | ICD-10-CM | POA: Diagnosis not present

## 2021-04-13 DIAGNOSIS — H11002 Unspecified pterygium of left eye: Secondary | ICD-10-CM | POA: Diagnosis not present

## 2021-04-13 DIAGNOSIS — L718 Other rosacea: Secondary | ICD-10-CM | POA: Diagnosis not present

## 2021-04-13 DIAGNOSIS — H0288A Meibomian gland dysfunction right eye, upper and lower eyelids: Secondary | ICD-10-CM | POA: Diagnosis not present

## 2021-04-13 DIAGNOSIS — H2513 Age-related nuclear cataract, bilateral: Secondary | ICD-10-CM | POA: Diagnosis not present

## 2021-04-21 ENCOUNTER — Other Ambulatory Visit: Payer: Self-pay

## 2021-04-21 ENCOUNTER — Ambulatory Visit: Payer: Self-pay

## 2021-04-21 ENCOUNTER — Encounter: Payer: Self-pay | Admitting: Orthopaedic Surgery

## 2021-04-21 ENCOUNTER — Ambulatory Visit (INDEPENDENT_AMBULATORY_CARE_PROVIDER_SITE_OTHER): Payer: BC Managed Care – PPO | Admitting: Orthopaedic Surgery

## 2021-04-21 VITALS — Ht 62.0 in | Wt 195.0 lb

## 2021-04-21 DIAGNOSIS — M25561 Pain in right knee: Secondary | ICD-10-CM | POA: Diagnosis not present

## 2021-04-21 NOTE — Progress Notes (Signed)
Office Visit Note   Patient: Maria Khan           Date of Birth: 1961-07-27           MRN: [id number] Visit Date: 04/21/2021              Requested by: Royann Shivers, PA-C [address] [place],  [place] [address] PCP: Royann Shivers, PA-C   Assessment & Plan: Visit Diagnoses:  1. Right knee pain, unspecified chronicity     Plan: We reviewed her MRI which showed high-grade tear of the posterior horn medial meniscus at the root.  Subchondral fracture of the periphery of the medial tibial plateau with marrow edema.  Grade 1 MCL sprain.  Irregular full-thickness cartilage loss areas in the medial patellar facet.  Hopefully with weight loss should get some improvement we discussed repeating the MRI scan to determine if there is anything arthroscopically that could be of benefit and see if the bone edema with subchondral fracture had healed.  She wants to work on the weight loss symptoms come back to see [place] in a few months.  If she gets increased problems she will call.  Follow-Up Instructions: No follow-ups on file.   Orders:  Orders Placed This Encounter  Procedures  . XR Knee 1-2 Views Right   No orders of the defined types were placed in this encounter.     Procedures: No procedures performed   Clinical Data: No additional findings.   Subjective: Chief Complaint  Patient presents with  . Right Knee - Pain    HPI 60 year old female returns with ongoing problems with her right knee.  She states some days it is okay other days she has significant problems.  She has great trouble going up and down stairs.  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  She uses Aleve or Advil every day uses Aleve roll-on also intermittent ice.  Her job involves standing all day and she states she has not missed any work.  She is  trying to work on losing some weight to help her knee.  With COVID she had had some weight gain and is trying to get this off currently.  Review of Systems negative for gout.  All other systems updated unchanged from 11/04/2020 office visit.  Patient had COVID at Christmas time took her many weeks to get over it.   Objective: Vital Signs: Ht 5\' 2"  (1.575 m)   Wt 195 lb (88.5 kg)   BMI 35.67 kg/m   Physical Exam Constitutional:      Appearance: She is well-developed.  HENT:     Head: Normocephalic.     Right Ear: External ear normal.     Left Ear: External ear normal.  Eyes:     Pupils: Pupils are equal, round, and reactive to light.  Neck:     Thyroid: No thyromegaly.     Trachea: No tracheal deviation.  Cardiovascular:     Rate and Rhythm: Normal rate.  Pulmonary:     Effort: Pulmonary effort is normal.  Abdominal:     Palpations: Abdomen is soft.  Skin:    General: Skin is warm and dry.  Neurological:     Mental Status: She is alert and oriented to person, place, and time.  Psychiatric:  Behavior: Behavior normal.     Ortho Exam medial joint line tenderness.  Tender over the pes bursa.  Knee does reach full extension. Specialty Comments:  No specialty comments available.  Imaging: No results found.   PMFS History: Patient Active Problem List   Diagnosis Date Noted  . Pain in right knee 09/26/2020  . Encounter for screening fecal occult blood testing 09/06/2020  . Encounter for gynecological examination with Papanicolaou smear of cervix 09/06/2020  . PMB (postmenopausal bleeding) 09/01/2015  . Stress at home 09/01/2015  . Back pain 05/20/2014  . Sleep disturbance 05/20/2014  . Menopause 05/20/2014  . Hormone replacement therapy (HRT) 05/20/2014   Past Medical History:  Diagnosis Date  . Dry eyes   . Essential hypertension   . GERD (gastroesophageal reflux disease)   . Menopause 05/20/2014  . Mitral valve prolapse   . Obesity   . On  postmenopausal hormone replacement therapy     Family History  Problem Relation Age of Onset  . Diabetes Mother   . Heart attack Mother   . Diabetes Sister   . Hypertension Sister   . Mitral valve prolapse Sister   . Diabetes Brother   . Hypertension Brother   . Diabetes Maternal Grandmother   . Diabetes Sister   . Hypertension Sister   . Diabetes Sister   . Hypertension Sister   . Diabetes Sister   . Hypertension Sister   . Melanoma Father     Past Surgical History:  Procedure Laterality Date  . DILATION AND CURETTAGE OF UTERUS    . WISDOM TOOTH EXTRACTION     Social History   Occupational History  . Not on file  Tobacco Use  . Smoking status: Never Smoker  . Smokeless tobacco: Never Used  Vaping Use  . Vaping Use: Never used  Substance and Sexual Activity  . Alcohol use: No  . Drug use: No  . Sexual activity: Not Currently    Birth control/protection: Post-menopausal

## 2021-05-08 ENCOUNTER — Other Ambulatory Visit: Payer: Self-pay

## 2021-05-08 ENCOUNTER — Ambulatory Visit
Admission: EM | Admit: 2021-05-08 | Discharge: 2021-05-08 | Disposition: A | Discharge status: Home / Self Care | Payer: BC Managed Care – PPO | Attending: Family Medicine | Admitting: Family Medicine

## 2021-05-08 DIAGNOSIS — M62831 Muscle spasm of calf: Secondary | ICD-10-CM | POA: Diagnosis not present

## 2021-05-08 MED ORDER — TIZANIDINE HCL 4 MG PO TABS: 2.0000 mg | ORAL_TABLET | Freq: Four times a day (QID) | ORAL | 0 refills | Status: AC | PRN

## 2021-05-08 NOTE — ED Provider Notes (Signed)
[facility]    CSN: [id number] Arrival date & time: 05/08/21  1556      History   Chief Complaint No chief complaint on file.   HPI Maria Khan is a 60 y.o. female.   HPI  Patient presents with acute onset left leg pain that began upon her arrival to work this today. Reports walking and felt like someone snapped in her calf (Left). No bruising, swelling, although endorses pain with weight bearing. No recent injury. No history hypercoagulability. No recent prolonged immobility or distant travel.   Past Medical History:  Diagnosis Date  . Dry eyes   . Essential hypertension   . GERD (gastroesophageal reflux disease)   . Menopause 05/20/2014  . Mitral valve prolapse   . Obesity   . On postmenopausal hormone replacement therapy     Patient Active Problem List   Diagnosis Date Noted  . Pain in right knee 09/26/2020  . Encounter for screening fecal occult blood testing 09/06/2020  . Encounter for gynecological examination with Papanicolaou smear of cervix 09/06/2020  . PMB (postmenopausal bleeding) 09/01/2015  . Stress at home 09/01/2015  . Back pain 05/20/2014  . Sleep disturbance 05/20/2014  . Menopause 05/20/2014  . Hormone replacement therapy (HRT) 05/20/2014    Past Surgical History:  Procedure Laterality Date  . DILATION AND CURETTAGE OF UTERUS    . WISDOM TOOTH EXTRACTION      OB History    Gravida  3   Para  2   Term      Preterm      AB  1   Living  2     SAB  1   IAB      Ectopic      Multiple      Live Births  2            Home Medications    Prior to Admission medications   Medication Sig Start Date End Date Taking? Authorizing Provider  albuterol (VENTOLIN HFA) 108 (90 Base) MCG/ACT inhaler Inhale into the lungs. 12/24/20   [provider]  ALPRAZolam Prudy Feeler) 0.5 MG tablet Take 0.5 mg by mouth 3 (three) times daily.     [provider]  aspirin EC 81 MG tablet Take 81 mg by mouth as  needed. For chest pains    [provider]  benzonatate (TESSALON) 100 MG capsule Take 100 mg by mouth 3 (three) times daily. 12/28/20   [provider]  CLIMARA PRO 0.045-0.015 MG/DAY APPLY 1 PATCH TOPICALLY TO THE SKIN 1 TIME A WEEK 02/04/21   Cyril Mourning A, NP  esomeprazole (NEXIUM) 40 MG capsule Take 40 mg by mouth as needed.     [provider]  ibuprofen (ADVIL) 800 MG tablet Take 800 mg by mouth 3 (three) times daily. 09/20/20   [provider]  ibuprofen (ADVIL,MOTRIN) 200 MG tablet Take 800 mg by mouth as needed. For pain    [provider]  lisinopril (ZESTRIL) 5 MG tablet Take 5 mg by mouth daily. 10/22/20   [provider]  Multiple Vitamins-Minerals (VITAMIN D3 COMPLETE PO) Take by mouth.    [provider]  Multiple Vitamins-Minerals (ZINC PO) Take by mouth.    [provider]  nadolol (CORGARD) 40 MG tablet Take 40 mg by mouth at bedtime.     [provider]  ondansetron (ZOFRAN-ODT) 8 MG disintegrating tablet Take 8 mg by mouth 3 (three) times daily. 09/13/20   [provider]  Polyethyl Glycol-Propyl Glycol (SYSTANE) 0.4-0.3 % GEL ophthalmic gel Apply 1 drop to eye at bedtime.    [provider]  predniSONE (DELTASONE) 20 MG tablet Take 20 mg by mouth 2 (two) times daily. 09/16/20   [provider]  Triamcinolone Acetonide (NASACORT ALLERGY 24HR NA) Place into the nose as needed.    [provider]    Family History Family History  Problem Relation Age of Onset  . Diabetes Mother   . Heart attack Mother   . Diabetes Sister   . Hypertension Sister   . Mitral valve prolapse Sister   . Diabetes Brother   . Hypertension Brother   . Diabetes Maternal Grandmother   . Diabetes Sister   . Hypertension Sister   . Diabetes Sister   . Hypertension Sister   . Diabetes Sister   . Hypertension Sister   . Melanoma Father     Social History Social History    Tobacco Use  . Smoking status: Never Smoker  . Smokeless tobacco: Never Used  Vaping Use  . Vaping Use: Never used  Substance Use Topics  . Alcohol use: No  . Drug use: No     Allergies   Augmentin [amoxicillin-pot clavulanate] and Codeine   Review of Systems Review of Systems Pertinent negatives listed in HPI Physical Exam Triage Vital Signs ED Triage Vitals [05/08/21 1602]  Enc Vitals Group     BP (!) 160/96     Pulse Rate 83     Resp 18     Temp 98.9 F (37.2 C)     Temp Source Oral     SpO2 97 %     Weight      Height      Head Circumference      Peak Flow      Pain Score 0     Pain Loc      Pain Edu?      Excl. in GC?    No data found.  Updated Vital Signs BP (!) 160/96 (BP Location: Right Arm)   Pulse 83   Temp 98.9 F (37.2 C) (Oral)   Resp 18   SpO2 97%   Visual Acuity Right Eye Distance:   Left Eye Distance:   Bilateral Distance:    Right Eye Near:   Left Eye Near:    Bilateral Near:     Physical Exam Cardiovascular:     Rate and Rhythm: Normal rate and regular rhythm.  Pulmonary:     Effort: Pulmonary effort is normal.     Breath sounds: Normal breath sounds.  Musculoskeletal:       Legs:     Comments: No ecchymosis or swelling.  Skin:    Capillary Refill: Capillary refill takes less than 2 seconds.  Neurological:     General: No focal deficit present.     Mental Status: She is alert. Mental status is at baseline.  Psychiatric:        Attention and Perception: Attention normal.        Mood and Affect: Mood normal.      UC Treatments / Results  Labs (all labs ordered are listed, but only abnormal results are displayed) Labs Reviewed - No data to display  EKG   Radiology No results found.  Procedures Procedures (including critical care time)  Medications Ordered in UC Medications - No data to display  Initial Impression / Assessment and Plan / UC Course  I have reviewed  the triage vital signs and the nursing  notes.  Pertinent labs & imaging results that were available during my care of the patient were reviewed by me and considered in my medical decision making (see chart for details).    Patient with acute onset left leg pain consistent with a muscle spasm. Trial antiinflammatory and Tizanidine.  Red flag precautions given Follow-up with Orthopedics if symptoms worsen or do not improve. Final Clinical Impressions(s) / UC Diagnoses   Final diagnoses:  Muscle spasm of left calf   Discharge Instructions   None    ED Prescriptions    Medication Sig Dispense Auth. Provider   tiZANidine (ZANAFLEX) 4 MG tablet Take 0.5-1 tablets (2-4 mg total) by mouth every 6 (six) hours as needed for muscle spasms. 30 tablet Bing Neighbors, FNP     PDMP not reviewed this encounter.   Bing Neighbors, FNP 05/10/21 2308

## 2021-05-08 NOTE — ED Triage Notes (Signed)
Pain in LT calf that started today.  States she felt like something snapped.  Denies any injury states it happened when she took a step.

## 2021-05-09 ENCOUNTER — Other Ambulatory Visit (HOSPITAL_COMMUNITY): Payer: Self-pay | Admitting: Physician Assistant

## 2021-05-09 DIAGNOSIS — M79605 Pain in left leg: Secondary | ICD-10-CM

## 2021-05-10 ENCOUNTER — Ambulatory Visit (HOSPITAL_COMMUNITY)
Admission: RE | Admit: 2021-05-10 | Discharge: 2021-05-10 | Disposition: A | Discharge status: Home / Self Care | Payer: BC Managed Care – PPO | Source: Ambulatory Visit | Attending: Physician Assistant | Admitting: Physician Assistant

## 2021-05-10 DIAGNOSIS — M79605 Pain in left leg: Secondary | ICD-10-CM | POA: Diagnosis not present

## 2021-05-21 ENCOUNTER — Other Ambulatory Visit: Payer: Self-pay | Admitting: Adult Health

## 2021-06-15 DIAGNOSIS — H0288A Meibomian gland dysfunction right eye, upper and lower eyelids: Secondary | ICD-10-CM | POA: Diagnosis not present

## 2021-06-15 DIAGNOSIS — L718 Other rosacea: Secondary | ICD-10-CM | POA: Diagnosis not present

## 2021-06-15 DIAGNOSIS — H11002 Unspecified pterygium of left eye: Secondary | ICD-10-CM | POA: Diagnosis not present

## 2021-06-15 DIAGNOSIS — H2513 Age-related nuclear cataract, bilateral: Secondary | ICD-10-CM | POA: Diagnosis not present

## 2021-07-20 DIAGNOSIS — R252 Cramp and spasm: Secondary | ICD-10-CM | POA: Diagnosis not present

## 2021-07-20 DIAGNOSIS — R609 Edema, unspecified: Secondary | ICD-10-CM | POA: Diagnosis not present

## 2021-07-20 DIAGNOSIS — Z6837 Body mass index (BMI) 37.0-37.9, adult: Secondary | ICD-10-CM | POA: Diagnosis not present

## 2021-07-20 DIAGNOSIS — R300 Dysuria: Secondary | ICD-10-CM | POA: Diagnosis not present

## 2021-07-22 DIAGNOSIS — H2513 Age-related nuclear cataract, bilateral: Secondary | ICD-10-CM | POA: Diagnosis not present

## 2021-07-22 DIAGNOSIS — Z111 Encounter for screening for respiratory tuberculosis: Secondary | ICD-10-CM | POA: Diagnosis not present

## 2021-07-22 DIAGNOSIS — Z113 Encounter for screening for infections with a predominantly sexual mode of transmission: Secondary | ICD-10-CM | POA: Diagnosis not present

## 2021-07-22 DIAGNOSIS — H3581 Retinal edema: Secondary | ICD-10-CM | POA: Diagnosis not present

## 2021-07-22 DIAGNOSIS — L718 Other rosacea: Secondary | ICD-10-CM | POA: Diagnosis not present

## 2021-07-22 DIAGNOSIS — H15001 Unspecified scleritis, right eye: Secondary | ICD-10-CM | POA: Diagnosis not present

## 2021-07-22 DIAGNOSIS — H11002 Unspecified pterygium of left eye: Secondary | ICD-10-CM | POA: Diagnosis not present

## 2021-07-22 DIAGNOSIS — Z112 Encounter for screening for other bacterial diseases: Secondary | ICD-10-CM | POA: Diagnosis not present

## 2021-08-01 DIAGNOSIS — H15001 Unspecified scleritis, right eye: Secondary | ICD-10-CM | POA: Diagnosis not present

## 2021-08-16 DIAGNOSIS — L718 Other rosacea: Secondary | ICD-10-CM | POA: Diagnosis not present

## 2021-08-16 DIAGNOSIS — H11002 Unspecified pterygium of left eye: Secondary | ICD-10-CM | POA: Diagnosis not present

## 2021-08-16 DIAGNOSIS — H15001 Unspecified scleritis, right eye: Secondary | ICD-10-CM | POA: Diagnosis not present

## 2021-08-16 DIAGNOSIS — H3581 Retinal edema: Secondary | ICD-10-CM | POA: Diagnosis not present

## 2021-08-16 DIAGNOSIS — H2513 Age-related nuclear cataract, bilateral: Secondary | ICD-10-CM | POA: Diagnosis not present

## 2021-09-15 ENCOUNTER — Ambulatory Visit (INDEPENDENT_AMBULATORY_CARE_PROVIDER_SITE_OTHER): Payer: BC Managed Care – PPO | Admitting: Adult Health

## 2021-09-15 ENCOUNTER — Encounter: Payer: Self-pay | Admitting: Adult Health

## 2021-09-15 ENCOUNTER — Other Ambulatory Visit: Payer: Self-pay

## 2021-09-15 VITALS — BP 126/73 | HR 63 | Ht 61.5 in | Wt 209.4 lb

## 2021-09-15 DIAGNOSIS — Z01419 Encounter for gynecological examination (general) (routine) without abnormal findings: Secondary | ICD-10-CM | POA: Diagnosis not present

## 2021-09-15 DIAGNOSIS — Z780 Asymptomatic menopausal state: Secondary | ICD-10-CM

## 2021-09-15 DIAGNOSIS — Z79890 Hormone replacement therapy (postmenopausal): Secondary | ICD-10-CM

## 2021-09-15 DIAGNOSIS — Z1231 Encounter for screening mammogram for malignant neoplasm of breast: Secondary | ICD-10-CM

## 2021-09-15 DIAGNOSIS — Z1212 Encounter for screening for malignant neoplasm of rectum: Secondary | ICD-10-CM

## 2021-09-15 DIAGNOSIS — Z1211 Encounter for screening for malignant neoplasm of colon: Secondary | ICD-10-CM | POA: Insufficient documentation

## 2021-09-15 LAB — HEMOCCULT GUIAC POC 1CARD (OFFICE): Fecal Occult Blood, POC: NEGATIVE

## 2021-09-15 MED ORDER — CLIMARA PRO 0.045-0.015 MG/DAY TD PTWK: MEDICATED_PATCH | TRANSDERMAL | 3 refills | Status: DC

## 2021-09-15 NOTE — Progress Notes (Signed)
Patient ID: Maria Khan, female   DOB: [birth year], 60 y.o.   MRN: [id number] History of Present Illness: Maria Khan is a 60 year old white female, married, PM in for a well woman gyn exam. PCP is Roma Kayser PA  Lab Results  Component Value Date   DIAGPAP  09/06/2020    - Negative for intraepithelial lesion or malignancy (NILM)   HPV NOT DETECTED 05/23/2017   HPVHIGH Negative 09/06/2020    Current Medications, Allergies, Past Medical History, Past Surgical History, Family History and Social History were reviewed in [employer] electronic medical record.     Review of Systems: Patient denies any headaches, hearing loss, fatigue, blurred vision, shortness of breath, chest pain, abdominal pain, problems with bowel movements, urination, or intercourse. (Not having sex). No joint pain or mood swings.  On climara pro, if late changing patch, will get hot flash Denies any vaginal bleeding  Physical Exam:BP 126/73 (BP Location: Right Arm, Patient Position: Sitting, Cuff Size: Normal)   Pulse 63   Ht 5' 1.5" (1.562 m)   Wt 209 lb 6.4 oz (95 kg)   BMI 38.93 kg/m   General:  Well developed, well nourished, no acute distress Skin:  Warm and dry Neck:  Midline trachea, normal thyroid, good ROM, no lymphadenopathy Lungs; Clear to auscultation bilaterally Breast:  No dominant palpable mass, retraction, or nipple discharge Cardiovascular: Regular rate and rhythm Abdomen:  Soft, non tender, no hepatosplenomegaly Pelvic:  External genitalia is normal in appearance, no lesions.  The vagina is normal in appearance. Urethra has no lesions or masses. The cervix is smooth.  Uterus is felt to be normal size, shape, and contour.  No adnexal masses or tenderness noted.Bladder is non tender, no masses felt. Rectal: Good sphincter tone, no polyps, or hemorrhoids felt.  Hemoccult negative. Extremities/musculoskeletal:  No swelling or varicosities noted, no clubbing or cyanosis,has brace right  knee Psych:  No mood changes, alert and cooperative,seems happy AA is 0 Fall risk is low Depression screen [facility] 2/9 09/15/2021 09/06/2020 05/23/2017  Decreased Interest 0 1 0  Down, Depressed, Hopeless 0 0 0  PHQ - 2 Score 0 1 0  Altered sleeping 0 2 -  Tired, decreased energy 1 1 -  Change in appetite 1 2 -  Feeling bad or failure about yourself  1 1 -  Trouble concentrating 0 0 -  Moving slowly or fidgety/restless 0 0 -  Suicidal thoughts 0 0 -  PHQ-9 Score 3 7 -  Difficult doing work/chores - Not difficult at all -    GAD 7 : Generalized Anxiety Score 09/15/2021 09/06/2020  Nervous, Anxious, on Edge 0 1  Control/stop worrying 0 1  Worry too much - different things 0 1  Trouble relaxing 1 1  Restless 0 1  Easily annoyed or irritable 1 1  Afraid - awful might happen 0 1  Total GAD 7 Score 2 7  Anxiety Difficulty - Not difficult at all      Upstream - 09/15/21 0839       Pregnancy Intention Screening   Does the patient want to become pregnant in the next year? No    Does the patient's partner want to become pregnant in the next year? No    Would the patient like to discuss contraceptive options today? No      Contraception Wrap Up   Current Method No Method - Other Reason   post-menopausal   End Method No Method - Other Reason  Contraception Counseling Provided No             Pt gave verbal consent for exam without chaperone.  Impression and Plan: 1. Encounter for gynecological examination with Papanicolaou smear of cervix Physical in 1 year Pap in 2024 Labs with PCP  2. Encounter for screening fecal occult blood testing  - POCT occult blood stool  3. Screening for colorectal cancer Cologuard ordered for her,she declines colonoscopy  - Cologuard  4. Hormone replacement therapy (HRT) Will refill climara pro for 1 year   5. Screening mammogram for breast cancer Pt [facility] in [place] for appt - MM 3D SCREEN BREAST BILATERAL;  Future  6. Postmenopause

## 2021-09-20 DIAGNOSIS — Z1211 Encounter for screening for malignant neoplasm of colon: Secondary | ICD-10-CM | POA: Diagnosis not present

## 2021-09-20 DIAGNOSIS — Z1212 Encounter for screening for malignant neoplasm of rectum: Secondary | ICD-10-CM | POA: Diagnosis not present

## 2021-09-27 LAB — COLOGUARD: COLOGUARD: NEGATIVE

## 2021-11-02 DIAGNOSIS — H2513 Age-related nuclear cataract, bilateral: Secondary | ICD-10-CM | POA: Diagnosis not present

## 2021-11-02 DIAGNOSIS — H0288A Meibomian gland dysfunction right eye, upper and lower eyelids: Secondary | ICD-10-CM | POA: Diagnosis not present

## 2021-11-02 DIAGNOSIS — L718 Other rosacea: Secondary | ICD-10-CM | POA: Diagnosis not present

## 2021-11-02 DIAGNOSIS — H11002 Unspecified pterygium of left eye: Secondary | ICD-10-CM | POA: Diagnosis not present

## 2021-11-15 ENCOUNTER — Ambulatory Visit
Admission: RE | Admit: 2021-11-15 | Discharge: 2021-11-15 | Disposition: A | Discharge status: Home / Self Care | Payer: BC Managed Care – PPO | Source: Ambulatory Visit | Attending: Adult Health | Admitting: Adult Health

## 2021-11-15 ENCOUNTER — Other Ambulatory Visit: Payer: Self-pay

## 2021-11-15 DIAGNOSIS — Z1231 Encounter for screening mammogram for malignant neoplasm of breast: Secondary | ICD-10-CM | POA: Diagnosis not present

## 2021-12-21 ENCOUNTER — Other Ambulatory Visit: Payer: Self-pay | Admitting: Adult Health

## 2022-02-12 ENCOUNTER — Other Ambulatory Visit: Payer: Self-pay | Admitting: Adult Health

## 2022-02-15 DIAGNOSIS — L718 Other rosacea: Secondary | ICD-10-CM | POA: Diagnosis not present

## 2022-02-15 DIAGNOSIS — H11002 Unspecified pterygium of left eye: Secondary | ICD-10-CM | POA: Diagnosis not present

## 2022-02-15 DIAGNOSIS — H2513 Age-related nuclear cataract, bilateral: Secondary | ICD-10-CM | POA: Diagnosis not present

## 2022-02-15 DIAGNOSIS — H0288A Meibomian gland dysfunction right eye, upper and lower eyelids: Secondary | ICD-10-CM | POA: Diagnosis not present

## 2022-03-19 IMAGING — MG MM DIGITAL SCREENING BILAT W/ TOMO AND CAD
8 series · 8 of 24 positions shown · non-contrast
Comparison: Previous exam(s).

CLINICAL DATA: Screening.

EXAM:
DIGITAL SCREENING BILATERAL MAMMOGRAM WITH TOMOSYNTHESIS AND CAD
TECHNIQUE: Bilateral screening digital craniocaudal and mediolateral oblique
mammograms were obtained. Bilateral screening digital breast
tomosynthesis was performed. The images were evaluated with
computer-aided detection.

[L MLO synth-2D]
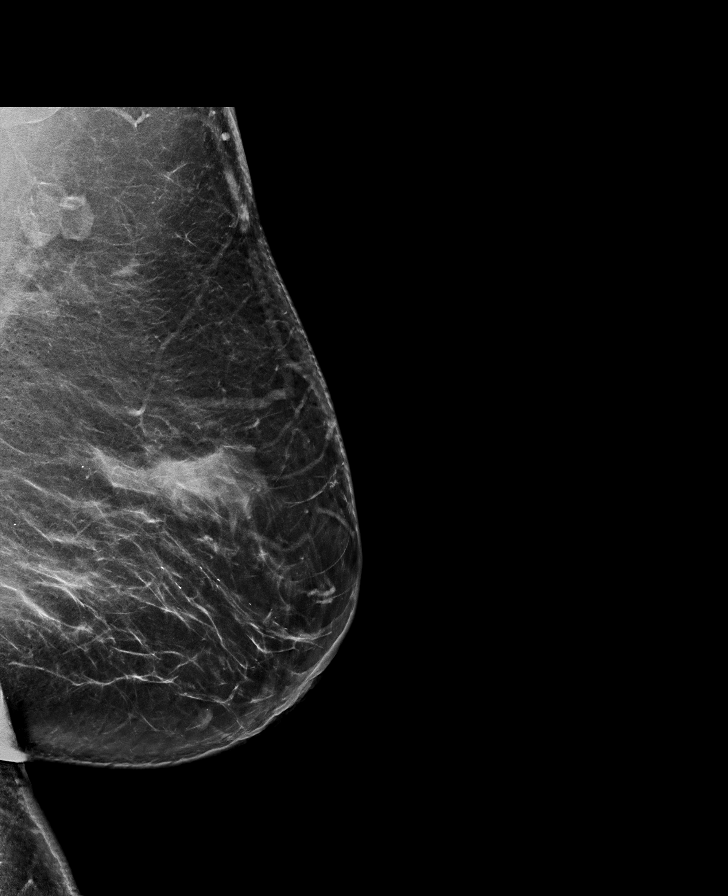

[L CC synth-2D]
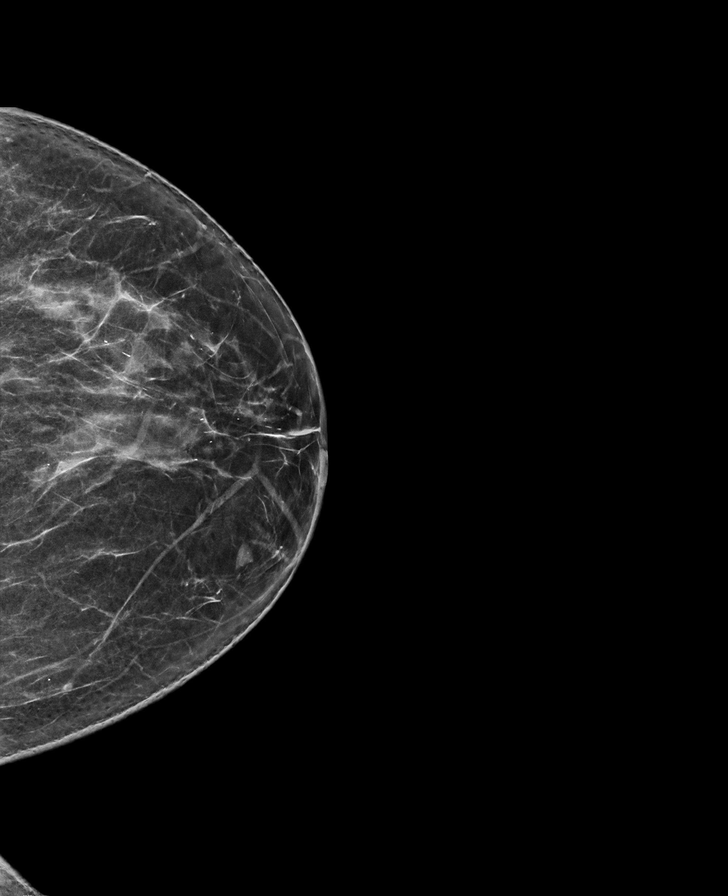

[R MLO synth-2D]
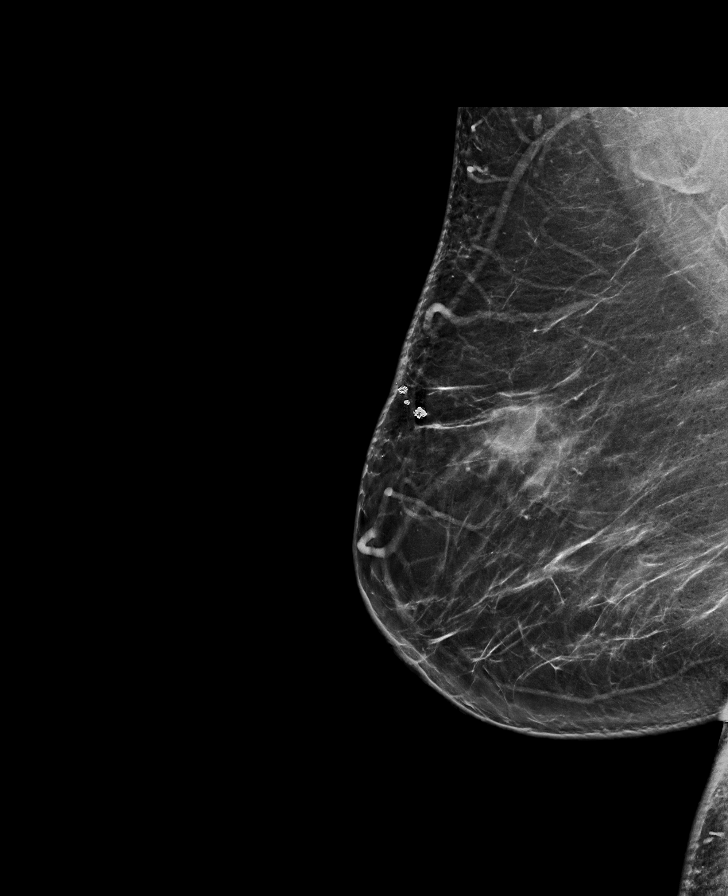

[R CC synth-2D]
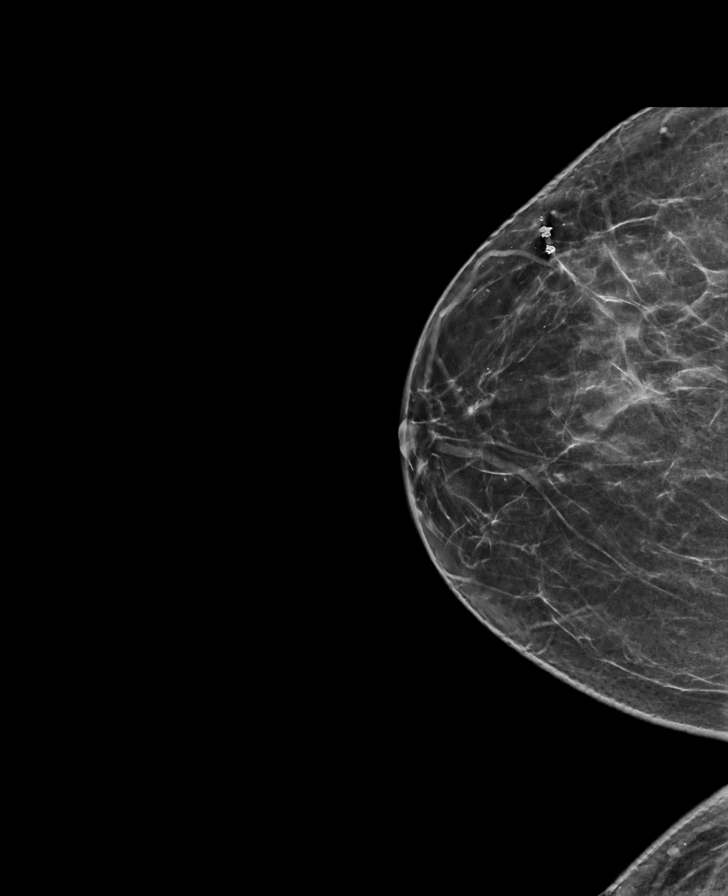

[L MLO tomo · tomo slice 45/88.0]
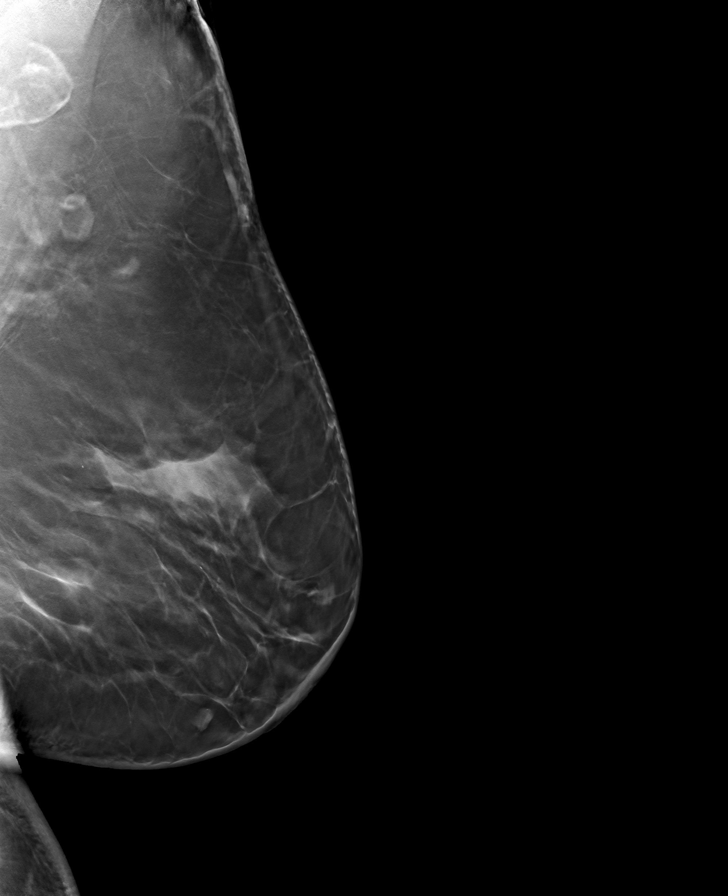

[L CC tomo · tomo slice 35/69.0]
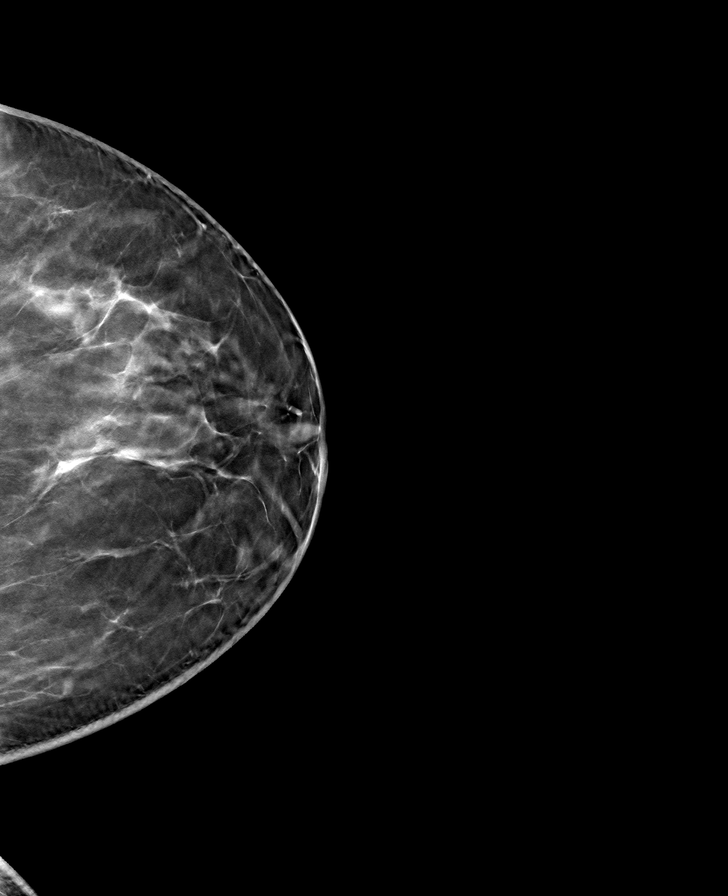

[R CC tomo · tomo slice 35/68.0]
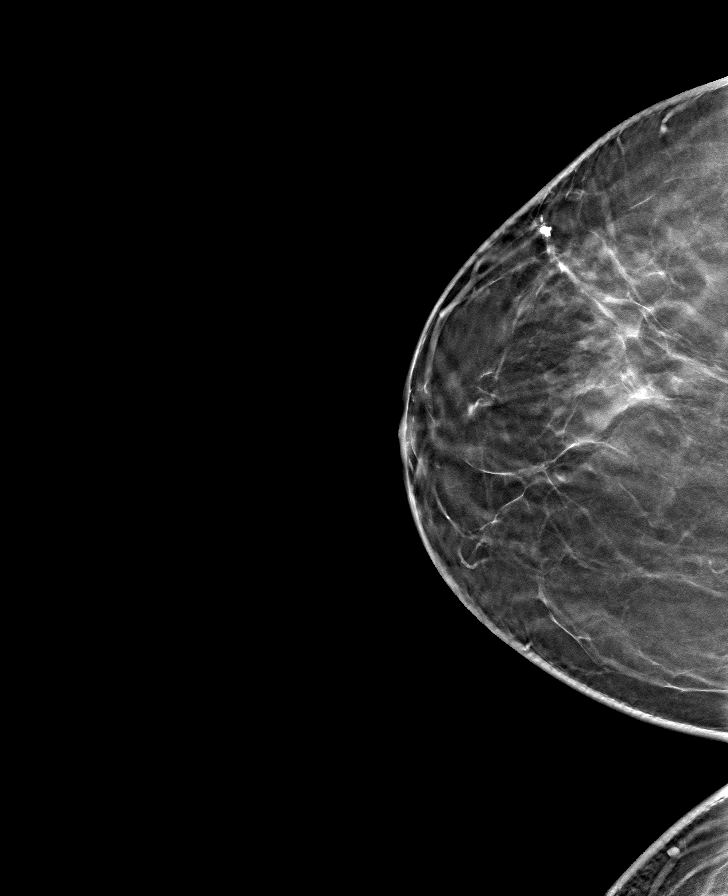

[R MLO tomo · tomo slice 43/85.0]
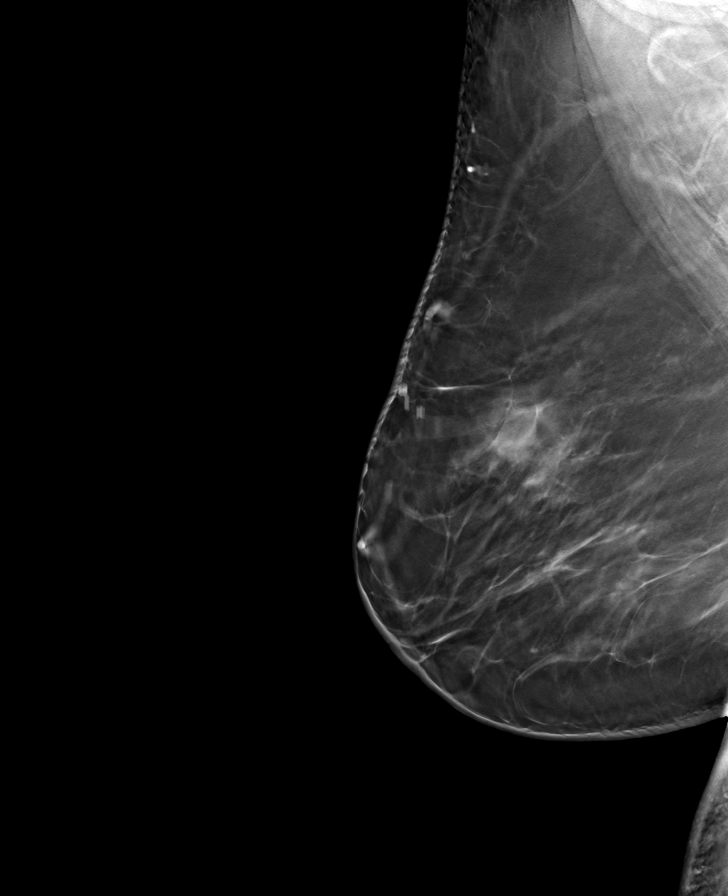

[8 of 24 positions shown; findings below may reference images not displayed]

ACR Breast Density Category c: The breast tissue is heterogeneously
dense, which may obscure small masses.
FINDINGS: There are no findings suspicious for malignancy.
IMPRESSION: No mammographic evidence of malignancy. A result letter of this
screening mammogram will be mailed directly to the patient.

RECOMMENDATION:
Screening mammogram in one year. (Code:Q3-W-BC3)

BI-RADS CATEGORY  1: Negative.

## 2022-04-12 DIAGNOSIS — H2513 Age-related nuclear cataract, bilateral: Secondary | ICD-10-CM | POA: Diagnosis not present

## 2022-04-12 DIAGNOSIS — H11002 Unspecified pterygium of left eye: Secondary | ICD-10-CM | POA: Diagnosis not present

## 2022-04-12 DIAGNOSIS — H0288A Meibomian gland dysfunction right eye, upper and lower eyelids: Secondary | ICD-10-CM | POA: Diagnosis not present

## 2022-04-12 DIAGNOSIS — L718 Other rosacea: Secondary | ICD-10-CM | POA: Diagnosis not present

## 2022-06-26 DIAGNOSIS — H2513 Age-related nuclear cataract, bilateral: Secondary | ICD-10-CM | POA: Diagnosis not present

## 2022-06-26 DIAGNOSIS — H0288A Meibomian gland dysfunction right eye, upper and lower eyelids: Secondary | ICD-10-CM | POA: Diagnosis not present

## 2022-06-26 DIAGNOSIS — L718 Other rosacea: Secondary | ICD-10-CM | POA: Diagnosis not present

## 2022-06-26 DIAGNOSIS — H11002 Unspecified pterygium of left eye: Secondary | ICD-10-CM | POA: Diagnosis not present

## 2022-12-06 DIAGNOSIS — H2513 Age-related nuclear cataract, bilateral: Secondary | ICD-10-CM | POA: Diagnosis not present

## 2022-12-06 DIAGNOSIS — H0288A Meibomian gland dysfunction right eye, upper and lower eyelids: Secondary | ICD-10-CM | POA: Diagnosis not present

## 2022-12-06 DIAGNOSIS — H11002 Unspecified pterygium of left eye: Secondary | ICD-10-CM | POA: Diagnosis not present

## 2022-12-06 DIAGNOSIS — L718 Other rosacea: Secondary | ICD-10-CM | POA: Diagnosis not present

## 2023-01-25 DIAGNOSIS — S233XXA Sprain of ligaments of thoracic spine, initial encounter: Secondary | ICD-10-CM | POA: Diagnosis not present

## 2023-01-25 DIAGNOSIS — M5441 Lumbago with sciatica, right side: Secondary | ICD-10-CM | POA: Diagnosis not present

## 2023-01-25 DIAGNOSIS — S335XXA Sprain of ligaments of lumbar spine, initial encounter: Secondary | ICD-10-CM | POA: Diagnosis not present

## 2023-01-30 DIAGNOSIS — S335XXA Sprain of ligaments of lumbar spine, initial encounter: Secondary | ICD-10-CM | POA: Diagnosis not present

## 2023-01-30 DIAGNOSIS — M5441 Lumbago with sciatica, right side: Secondary | ICD-10-CM | POA: Diagnosis not present

## 2023-01-30 DIAGNOSIS — S233XXA Sprain of ligaments of thoracic spine, initial encounter: Secondary | ICD-10-CM | POA: Diagnosis not present

## 2023-02-01 DIAGNOSIS — S335XXA Sprain of ligaments of lumbar spine, initial encounter: Secondary | ICD-10-CM | POA: Diagnosis not present

## 2023-02-01 DIAGNOSIS — M5441 Lumbago with sciatica, right side: Secondary | ICD-10-CM | POA: Diagnosis not present

## 2023-02-01 DIAGNOSIS — S233XXA Sprain of ligaments of thoracic spine, initial encounter: Secondary | ICD-10-CM | POA: Diagnosis not present

## 2023-02-08 DIAGNOSIS — M5441 Lumbago with sciatica, right side: Secondary | ICD-10-CM | POA: Diagnosis not present

## 2023-02-08 DIAGNOSIS — S335XXA Sprain of ligaments of lumbar spine, initial encounter: Secondary | ICD-10-CM | POA: Diagnosis not present

## 2023-02-08 DIAGNOSIS — S233XXA Sprain of ligaments of thoracic spine, initial encounter: Secondary | ICD-10-CM | POA: Diagnosis not present

## 2023-02-13 DIAGNOSIS — S233XXA Sprain of ligaments of thoracic spine, initial encounter: Secondary | ICD-10-CM | POA: Diagnosis not present

## 2023-02-13 DIAGNOSIS — S335XXA Sprain of ligaments of lumbar spine, initial encounter: Secondary | ICD-10-CM | POA: Diagnosis not present

## 2023-02-13 DIAGNOSIS — M5441 Lumbago with sciatica, right side: Secondary | ICD-10-CM | POA: Diagnosis not present

## 2023-02-20 DIAGNOSIS — M5441 Lumbago with sciatica, right side: Secondary | ICD-10-CM | POA: Diagnosis not present

## 2023-02-20 DIAGNOSIS — S335XXA Sprain of ligaments of lumbar spine, initial encounter: Secondary | ICD-10-CM | POA: Diagnosis not present

## 2023-02-20 DIAGNOSIS — S233XXA Sprain of ligaments of thoracic spine, initial encounter: Secondary | ICD-10-CM | POA: Diagnosis not present

## 2023-02-27 ENCOUNTER — Other Ambulatory Visit: Payer: Self-pay | Admitting: Adult Health

## 2023-08-22 ENCOUNTER — Other Ambulatory Visit: Payer: Self-pay | Admitting: Physician Assistant

## 2023-08-22 DIAGNOSIS — Z1231 Encounter for screening mammogram for malignant neoplasm of breast: Secondary | ICD-10-CM

## 2023-09-13 ENCOUNTER — Ambulatory Visit
Admission: RE | Admit: 2023-09-13 | Discharge: 2023-09-13 | Disposition: A | Discharge status: Home / Self Care | Payer: BC Managed Care – PPO | Source: Ambulatory Visit | Attending: Physician Assistant | Admitting: Physician Assistant

## 2023-09-13 DIAGNOSIS — Z1231 Encounter for screening mammogram for malignant neoplasm of breast: Secondary | ICD-10-CM

## 2023-09-16 ENCOUNTER — Encounter (HOSPITAL_COMMUNITY): Payer: Self-pay

## 2023-09-16 ENCOUNTER — Emergency Department (HOSPITAL_COMMUNITY)
Admission: EM | Admit: 2023-09-16 | Discharge: 2023-09-16 | Disposition: A | Discharge status: Home / Self Care | Payer: BC Managed Care – PPO | Attending: Emergency Medicine | Admitting: Emergency Medicine

## 2023-09-16 ENCOUNTER — Other Ambulatory Visit: Payer: Self-pay

## 2023-09-16 ENCOUNTER — Emergency Department (HOSPITAL_COMMUNITY): Payer: BC Managed Care – PPO

## 2023-09-16 DIAGNOSIS — M25562 Pain in left knee: Secondary | ICD-10-CM | POA: Diagnosis not present

## 2023-09-16 DIAGNOSIS — Z7982 Long term (current) use of aspirin: Secondary | ICD-10-CM | POA: Diagnosis not present

## 2023-09-16 DIAGNOSIS — M25462 Effusion, left knee: Secondary | ICD-10-CM | POA: Insufficient documentation

## 2023-09-16 MED ORDER — KETOROLAC TROMETHAMINE 10 MG PO TABS: 10.0000 mg | ORAL_TABLET | Freq: Four times a day (QID) | ORAL | 0 refills | Status: AC | PRN

## 2023-09-16 NOTE — ED Provider Notes (Signed)
Patient is a handoff from [place], [place]. Plan is to wait for xray imaging but suspect likely ACL or other ligamental injury. Patient being placed into knee immobilizer and will require ortho follow up outpatient. Physical Exam  BP (!) 158/89 (BP Location: Right Arm)   Pulse 87   Temp 98.4 F (36.9 C) (Oral)   Resp 17   Ht 5\' 1"  (1.549 m)   Wt 90.7 kg   SpO2 98%   BMI 37.79 kg/m   Physical Exam Vitals reviewed.  Constitutional:      Appearance: Normal appearance.  Musculoskeletal:        General: Swelling and tenderness present.     Comments: Swollen tender left knee, effusion, pain with range of motion, neurovascular neurosensory intact  Skin:    General: Skin is warm.  Neurological:     General: No focal deficit present.     Mental Status: She is alert.     Procedures  Procedures  ED Course / MDM    Medical Decision Making Amount and/or Complexity of Data Reviewed Radiology: ordered.  Risk Prescription drug management.   Patient is a handoff from [place], [place].  Please see their note for full HPI and physical exam findings.  Plan is to wait for xray imaging but suspect likely ACL or other ligamental injury. Patient being placed into knee immobilizer and will require ortho follow up outpatient.  X-ray reassure without any acute injuries noted such as a fracture or dislocation.  Physical exam on my hand also reveals similar findings to prior PA with some mild swelling to the left knee noted with preserved range of motion although pain is present.  No appreciable cyst in the posterior popliteal space.  Low concern for cyst or DVT at this time given the patient's injury was sustained with activity and did not come on insidiously.  Patient provided with contact information for Dr. Constance Goltz, orthopedics, for further evaluation.  Suspicion at this time is for possible ligamental versus meniscal injury.  Advised strict return precautions.  Patient discharged home in stable condition.    Smitty Knudsen, PA-C 09/16/23 2016    Glyn Ade, MD 09/16/23 2226

## 2023-09-16 NOTE — ED Triage Notes (Signed)
Pt arrived POV from home c/o left leg pain. Pt states 2 days ago she felt a pop or snap in the left knee and now it is extremely painful and she is unable to walk on it.

## 2023-09-16 NOTE — Discharge Instructions (Addendum)
You are seen in the emergency department today for knee pain.  Thankfully your x-ray imaging was negative.  However there is concern about a possible ligamental or meniscal injury in your left knee given your reported history.  Please reach out to Dr. Mickle Asper office for further evaluation as you may require further imaging if you are having difficulty improving.  You are placed into a knee immobilizer to help stabilize your knee.  Try to avoid bearing weight on this knee as this can worsen your pain.

## 2023-09-16 NOTE — ED Provider Notes (Signed)
[place] [facility] AT [facility] Provider Note   CSN: [id number] Arrival date & time: 09/16/23  1305     History  Chief Complaint  Patient presents with   Knee Pain    Maria Khan is a 62 y.o. female.  Patient complains of pain in her left knee.  Patient reports that she was walking 2 days ago and had a pop in her left knee.  Patient reports that she has had a previous ACL tear in her right knee.  Patient is being treated conservatively without surgery.  Patient reports that the pain in her left knee is exactly like when she tore her ACL on her right side.  Patient denies any falls she denies any other area of injury.  Patient does have a walker at home that she has been using.  The history is provided by the patient. No language interpreter was used.  Knee Pain      Home Medications Prior to Admission medications   Medication Sig Start Date End Date Taking? Authorizing Provider  ALPRAZolam Prudy Feeler) 0.5 MG tablet Take 0.5 mg by mouth 3 (three) times daily.     [provider]  aspirin EC 81 MG tablet Take 81 mg by mouth as needed. For chest pains    [provider]  benzonatate (TESSALON) 100 MG capsule Take 100 mg by mouth 3 (three) times daily. 12/28/20   [provider]  CLIMARA PRO 0.045-0.015 MG/DAY APPLY 1 PATCH TOPICALLY TO THE SKIN 1 TIME A WEEK 02/27/23   Cyril Mourning A, NP  esomeprazole (NEXIUM) 40 MG capsule Take 40 mg by mouth as needed.     [provider]  ibuprofen (ADVIL) 800 MG tablet Take 800 mg by mouth 3 (three) times daily. 09/20/20   [provider]  ibuprofen (ADVIL,MOTRIN) 200 MG tablet Take 800 mg by mouth as needed. For pain    [provider]  loteprednol (LOTEMAX) 0.5 % ophthalmic suspension 1 drop 4 (four) times daily. 08/18/21   [provider]  Multiple Vitamins-Minerals (VITAMIN D3 COMPLETE PO) Take by mouth.    [provider]  Multiple  Vitamins-Minerals (ZINC PO) Take by mouth.    [provider]  nadolol (CORGARD) 40 MG tablet Take 40 mg by mouth at bedtime.     [provider]  ondansetron (ZOFRAN-ODT) 8 MG disintegrating tablet Take 8 mg by mouth 3 (three) times daily. 09/13/20   [provider]  Polyethyl Glycol-Propyl Glycol (SYSTANE) 0.4-0.3 % GEL ophthalmic gel Apply 1 drop to eye at bedtime.    [provider]  predniSONE (DELTASONE) 20 MG tablet Take 20 mg by mouth 2 (two) times daily. 09/16/20   [provider]  tiZANidine (ZANAFLEX) 4 MG tablet Take 0.5-1 tablets (2-4 mg total) by mouth every 6 (six) hours as needed for muscle spasms. 05/08/21   Bing Neighbors, NP  Triamcinolone Acetonide (NASACORT ALLERGY 24HR NA) Place into the nose as needed.    [provider]      Allergies    Augmentin [amoxicillin-pot clavulanate] and Codeine    Review of Systems   Review of Systems  All other systems reviewed and are negative.   Physical Exam Updated Vital Signs BP (!) 162/99 (BP Location: Left Arm)   Pulse 68   Temp 98.2 F (36.8 C) (Oral)   Resp 18   Ht 5\' 1"  (1.549 m)   Wt 90.7 kg   SpO2 96%   BMI  37.79 kg/m  Physical Exam Vitals reviewed.  Constitutional:      Appearance: Normal appearance.  Musculoskeletal:        General: Swelling and tenderness present.     Comments: Swollen tender left knee, effusion, pain with range of motion, neurovascular neurosensory intact  Skin:    General: Skin is warm.  Neurological:     General: No focal deficit present.     Mental Status: She is alert.     ED Results / Procedures / Treatments   Labs (all labs ordered are listed, but only abnormal results are displayed) Labs Reviewed - No data to display  EKG None  Radiology No results found.  Procedures Procedures    Medications Ordered in ED Medications - No data to display  ED Course/ Medical Decision Making/ A&P                                  Medical Decision Making Patient is concerned she has an ACL tear in her left knee.  Patient has had a previous right ACL tear 1 year ago  Amount and/or Complexity of Data Reviewed Independent Historian: spouse    Details: Is here with her husband who is supportive Radiology: ordered and independent interpretation performed. Decision-making details documented in ED Course.    Details: X-ray left knee ordered  Risk Risk Details: And is to place patient in a knee immobilizer.  Patient advised to follow-up with orthopedist.  Patient does not want to see her previous orthopedist she is given the phone number for Dr. Charlann Boxer.  Patient inquires about having an MRI today.  I advised her that she should follow-up with the orthopedist who will determine if she needs an outpatient MRI   Pt's care turned over to PA Zelaya, xray pending        Final Clinical Impression(s) / ED Diagnoses Final diagnoses:  Effusion, left knee    Rx / DC Orders ED Discharge Orders     None     An After Visit Summary was printed and given to the patient.     Elson Areas, PA-C 09/16/23 1520    Linwood Dibbles, MD 09/17/23 [phone]

## 2023-09-16 NOTE — Progress Notes (Signed)
Orthopedic Tech Progress Note Patient Details:  Tristine Langi February 06, 1961 [id number]  Ortho Devices Type of Ortho Device: Knee Immobilizer Ortho Device/Splint Location: LLE Ortho Device/Splint Interventions: Ordered, Adjustment, Application   Post Interventions Patient Tolerated: Fair Instructions Provided: Care of device, Adjustment of device  Sharonda Llamas Carmine Savoy 09/16/2023, 5:57 PM

## 2023-09-18 DIAGNOSIS — M25562 Pain in left knee: Secondary | ICD-10-CM | POA: Diagnosis not present

## 2023-10-05 DIAGNOSIS — M25562 Pain in left knee: Secondary | ICD-10-CM | POA: Diagnosis not present

## 2023-10-16 DIAGNOSIS — S83242A Other tear of medial meniscus, current injury, left knee, initial encounter: Secondary | ICD-10-CM | POA: Diagnosis not present

## 2023-11-06 DIAGNOSIS — M25562 Pain in left knee: Secondary | ICD-10-CM | POA: Diagnosis not present

## 2023-11-14 DIAGNOSIS — M25562 Pain in left knee: Secondary | ICD-10-CM | POA: Diagnosis not present

## 2023-11-19 ENCOUNTER — Ambulatory Visit: Payer: BC Managed Care – PPO

## 2023-11-21 DIAGNOSIS — M25562 Pain in left knee: Secondary | ICD-10-CM | POA: Diagnosis not present

## 2023-11-29 DIAGNOSIS — S83242D Other tear of medial meniscus, current injury, left knee, subsequent encounter: Secondary | ICD-10-CM | POA: Diagnosis not present

## 2024-04-11 ENCOUNTER — Telehealth: Payer: Self-pay | Admitting: *Deleted

## 2024-04-11 NOTE — Telephone Encounter (Signed)
 Pt aware her insurance will no longer be covering the Combipatch  started July 1. Pt states she is wanting to come off of the patch and has been cutting the patch in half X 6 months. I spoke with JAG. Pt was advised to continue cutting patch in half until patches are all gone. Pt voiced understanding. JSY
